# Patient Record
Sex: Female | Born: 1970 | Race: Black or African American | Hispanic: No | Marital: Married | Smoking: Never smoker
Health system: Southern US, Community
[De-identification: ages and names within clinical notes are randomized; demographics above are authoritative.]

## PROBLEM LIST (undated history)

## (undated) DIAGNOSIS — E119 Type 2 diabetes mellitus without complications: Secondary | ICD-10-CM

## (undated) DIAGNOSIS — I10 Essential (primary) hypertension: Secondary | ICD-10-CM

## (undated) DIAGNOSIS — E7800 Pure hypercholesterolemia, unspecified: Secondary | ICD-10-CM

## (undated) HISTORY — DX: Essential (primary) hypertension: I10

## (undated) HISTORY — PX: BREAST REDUCTION SURGERY: SHX8

## (undated) HISTORY — DX: Type 2 diabetes mellitus without complications: E11.9

---

## 1992-04-14 HISTORY — PX: REDUCTION MAMMAPLASTY: SUR839

## 1997-07-25 ENCOUNTER — Other Ambulatory Visit: Admission: RE | Admit: 1997-07-25 | Discharge: 1997-07-25 | Payer: Self-pay | Admitting: *Deleted

## 1997-07-25 ENCOUNTER — Ambulatory Visit (HOSPITAL_COMMUNITY): Admission: RE | Admit: 1997-07-25 | Discharge: 1997-07-25 | Payer: Self-pay | Admitting: *Deleted

## 1997-10-25 ENCOUNTER — Ambulatory Visit (HOSPITAL_COMMUNITY): Admission: RE | Admit: 1997-10-25 | Discharge: 1997-10-25 | Payer: Self-pay | Admitting: *Deleted

## 1997-11-15 ENCOUNTER — Inpatient Hospital Stay (HOSPITAL_COMMUNITY): Admission: AD | Admit: 1997-11-15 | Discharge: 1997-11-18 | Payer: Self-pay | Admitting: Obstetrics

## 1999-06-07 ENCOUNTER — Inpatient Hospital Stay (HOSPITAL_COMMUNITY): Admission: AD | Admit: 1999-06-07 | Discharge: 1999-06-07 | Payer: Self-pay | Admitting: *Deleted

## 1999-06-07 ENCOUNTER — Encounter: Payer: Self-pay | Admitting: Obstetrics

## 1999-06-13 ENCOUNTER — Other Ambulatory Visit: Admission: RE | Admit: 1999-06-13 | Discharge: 1999-06-13 | Payer: Self-pay | Admitting: Obstetrics

## 1999-06-29 ENCOUNTER — Emergency Department (HOSPITAL_COMMUNITY): Admission: EM | Admit: 1999-06-29 | Discharge: 1999-06-29 | Payer: Self-pay | Admitting: Emergency Medicine

## 1999-10-01 ENCOUNTER — Inpatient Hospital Stay (HOSPITAL_COMMUNITY): Admission: AD | Admit: 1999-10-01 | Discharge: 1999-10-04 | Payer: Self-pay | Admitting: Obstetrics

## 1999-10-01 ENCOUNTER — Encounter (INDEPENDENT_AMBULATORY_CARE_PROVIDER_SITE_OTHER): Payer: Self-pay

## 2002-09-12 ENCOUNTER — Encounter: Admission: RE | Admit: 2002-09-12 | Discharge: 2002-09-12 | Payer: Self-pay | Admitting: Internal Medicine

## 2002-09-12 ENCOUNTER — Encounter: Payer: Self-pay | Admitting: Internal Medicine

## 2003-04-15 HISTORY — PX: NOVASURE ABLATION: SHX5394

## 2004-01-10 ENCOUNTER — Ambulatory Visit (HOSPITAL_COMMUNITY): Admission: RE | Admit: 2004-01-10 | Discharge: 2004-01-10 | Payer: Self-pay | Admitting: Obstetrics

## 2004-01-10 ENCOUNTER — Encounter (INDEPENDENT_AMBULATORY_CARE_PROVIDER_SITE_OTHER): Payer: Self-pay | Admitting: Specialist

## 2005-02-18 ENCOUNTER — Emergency Department (HOSPITAL_COMMUNITY): Admission: EM | Admit: 2005-02-18 | Discharge: 2005-02-19 | Payer: Self-pay | Admitting: *Deleted

## 2006-09-11 ENCOUNTER — Emergency Department (HOSPITAL_COMMUNITY): Admission: EM | Admit: 2006-09-11 | Discharge: 2006-09-11 | Payer: Self-pay | Admitting: Emergency Medicine

## 2010-08-30 NOTE — Discharge Summary (Signed)
[facility]  Patient:    Alyssa Buck, Alyssa Buck                     MRN: [id number] Adm. Date:  [id number] Disc. Date: [id number] Attending:  Venita Sheffield                           Discharge Summary  HOSPITAL COURSE:              Patient is a 40 year old gravida 3, para 2-0-0-2, whose EDC was October 03, 1999.  She had two previous C-sections and she was in for repeat cesarean section and tubal ligation.  She had a positive group B strep.  On October 01, 1999, she underwent repeat low transverse cesarean section and tubal ligation.  She had a female, Apgars 8/9, weighing 8 pounds 10 ounces.  The placenta was fundal and the amniotic fluid was meconium stained.  On admission, her hemoglobin was 11; post delivery, it was 8.8.  She did well and was discharged home on the third postoperative day, ambulatory, on a regular diet, on Tylenol No. 3 one q.4h. p.r.n., to see me in six weeks.  DISCHARGE DIAGNOSES:          1. Status post repeat low transverse cesarean                                  section, at term.                               2. Tubal ligation for multiparity. DD:  11/07/99 TD:  11/08/99 Job: [id number]

## 2010-08-30 NOTE — Op Note (Signed)
[name], Alyssa Buck              ACCOUNT NO.:  [id number]   MEDICAL RECORD NO.:  [id number]          PATIENT TYPE:  AMB   LOCATION:  SDC                           FACILITY:  [facility]   PHYSICIAN:  Kathreen Cosier, M.D.DATE OF BIRTH:  08/07/1970   DATE OF PROCEDURE:  01/10/2004  DATE OF DISCHARGE:                                 OPERATIVE REPORT   PREOPERATIVE DIAGNOSIS:  Dysfunctional uterine bleeding.   POSTOPERATIVE DIAGNOSIS:  Dysfunctional uterine bleeding.   PROCEDURE:  Sharp curettage, hysteroscopy, NovaSure ablation.   SURGEON:  Kathreen Cosier, M.D.   DESCRIPTION OF PROCEDURE:  Under general anesthesia, laminaria was removed  from the cervix, and the perineum, vagina were prepped and draped.  Speculum  was placed in the vagina.  Anterior lip of the cervix grasped with the  tenaculum, and the cervix curetted with a small amount of tissue obtained.  The endometrial cavity was sounded to 11 cm, and the cervical length was  measured at 5 cm.  The uterine cavity length was 5 cm.  The cervix was  dilated to a #27 Shawnie Pons and the hysteroscope inserted.  The cavity showed  some polyps and the procedure was terminated.  A sharp curettage was then  performed and a large amount of tissue obtained.  The NovaSure device was  then inserted, and the cavity integrity was established.  Then, NovaSure  ablation was done for 49 seconds under power of __________ watts.  The  cavity was 4.5 cm.  After the ablation, the hysteroscopy was redone, and the  hysteroscopy was redone, and the cavity was totally ablated.  The fluid  deficit from the hysteroscopy was 30 mL.  The patient tolerated the  procedure well and was taken to the recovery room in good condition.      BAM/MEDQ  D:  01/10/2004  T:  01/10/2004  Job:  [id number]

## 2018-12-07 ENCOUNTER — Other Ambulatory Visit: Payer: Self-pay

## 2018-12-07 DIAGNOSIS — Z20822 Contact with and (suspected) exposure to covid-19: Secondary | ICD-10-CM

## 2018-12-08 LAB — NOVEL CORONAVIRUS, NAA: SARS-CoV-2, NAA: NOT DETECTED

## 2019-08-29 ENCOUNTER — Encounter: Payer: Self-pay | Admitting: Internal Medicine

## 2019-08-29 ENCOUNTER — Ambulatory Visit: Payer: Self-pay | Admitting: Internal Medicine

## 2019-08-29 VITALS — BP 161/96 | HR 91 | Temp 98.6°F | Ht 68.0 in | Wt 196.8 lb

## 2019-08-29 DIAGNOSIS — I10 Essential (primary) hypertension: Secondary | ICD-10-CM

## 2019-08-29 DIAGNOSIS — E042 Nontoxic multinodular goiter: Secondary | ICD-10-CM

## 2019-08-29 DIAGNOSIS — Z780 Asymptomatic menopausal state: Secondary | ICD-10-CM

## 2019-08-29 MED ORDER — LISINOPRIL-HYDROCHLOROTHIAZIDE 20-12.5 MG PO TABS: 1.0000 | ORAL_TABLET | Freq: Every day | ORAL | 0 refills | Status: DC

## 2019-08-29 NOTE — Assessment & Plan Note (Addendum)
Lump in throat since 2003. At prior pcp office she states that her thyroid levels were within normal range. Denies hair loss, heat/cold intolerance, sweating, nail changes, mood changes. Paternal aunt had thyroid issues.   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  Did a point-of-care ultrasound at bedside which showed several nodules on both sides of thyroid.  -get records from [facility] -Thyroid ultrasound -tsh -Patient follow-up with financial services to get assistance with affording these tests.

## 2019-08-29 NOTE — Patient Instructions (Addendum)
It was a pleasure to see you today [name]. Please make the following changes:  For your thyroid: -we got bloodwork  -please get thyroid ultrasound done   For blood pressure:  Please start taking lisinopril-hydrochlorothiazide 20-12.5mg  once daily  Follow up in 4 weeks  If you have any questions or concerns, please call our clinic at [phone] between 9am-5pm and after hours call [phone] and ask for the internal medicine resident on call. If you feel you are having a medical emergency please call 911.   Thank you, we look forward to help you remain healthy!  Lorenso Courier, MD Internal Medicine PGY3

## 2019-08-29 NOTE — Assessment & Plan Note (Signed)
Patient states that she has had irregular periods over the past year.  Her last menstrual period was in the beginning of May and prior to that was in March 2021.  She states that her cycles usually last 7 days.  The amount of menstrual bleeding that she has had has decreased significantly since her NovaSure procedure.  She is currently sexually active and does not use protection.  Her she had a tubal ligation done in 2001.  She has accompanied symptoms of hot flashes at night, vaginal dryness.  Assessment and plan  Patient signs and symptoms are consistent with menopause.  We will continue to monitor patient and if her symptoms are excessive can consider symptom reduction treatment at that time.

## 2019-08-29 NOTE — Assessment & Plan Note (Signed)
The patient has an elevated blood pressure of 156/103 and the repeat was 161/96. She has a strong family history of hypertension.  She is overweight with a BMI of 29.  Assessment and plan Patient's blood pressure is uncontrolled and will likely need to antihypertensive agents.  We will start on a combination lisinopril-hydrochlorothiazide 20-12.5 mg daily pill.  He is also to do lifestyle modifications.  We will follow-up in 4 weeks.

## 2019-08-29 NOTE — Progress Notes (Signed)
   CC: lump in throat and abnormal uterine bleeding  HPI:  Alyssa Buck is a 49 y.o. with no known medical problems who presents for evaluation of lump in throat and decreased menstrual bleeding. Please see problem based charting for evaluation, assessment, and plan.   No past medical history on file.   Social History   Socioeconomic History  . Marital status: Married    Spouse name: Not on file  . Number of children: Not on file  . Years of education: Not on file  . Highest education level: Not on file  Occupational History  . Occupation: early childhood educator  Tobacco Use  . Smoking status: Never Smoker  Substance and Sexual Activity  . Alcohol use: Not on file  . Drug use: Never  . Sexual activity: Not on file  Other Topics Concern  . Not on file  Social History Narrative  . Not on file   Social Determinants of Health   Financial Resource Strain:   . Difficulty of Paying Living Expenses:   Food Insecurity:   . Worried About Programme researcher, broadcasting/film/video in the Last Year:   . Barista in the Last Year:   Transportation Needs:   . Freight forwarder (Medical):   Marland Kitchen Lack of Transportation (Non-Medical):   Physical Activity:   . Days of Exercise per Week:   . Minutes of Exercise per Session:   Stress:   . Feeling of Stress :   Social Connections:   . Frequency of Communication with Friends and Family:   . Frequency of Social Gatherings with Friends and Family:   . Attends Religious Services:   . Active Member of Clubs or Organizations:   . Attends Banker Meetings:   Marland Kitchen Marital Status:    Family History  Problem Relation Age of Onset  . Hypertension Mother   . Hypertension Father   . Diabetes Mellitus II Father   . Hypertension Maternal Grandmother   . Heart failure Maternal Grandmother   . Lymphoma Maternal Grandmother        big lymph nodes   . Hypertension Paternal Grandmother   . Breast cancer Maternal Aunt    Review of  Systems:   Per hpi  Physical Exam:  Vitals:   08/29/19 1317  BP: (!) 156/103  Pulse: 94  Temp: 98.6 F (37 C)  TempSrc: Oral  SpO2: 100%  Weight: 196 lb 12.8 oz (89.3 kg)  Height: 5\' 8"  (1.727 m)   Physical Exam  Constitutional: She is oriented to person, place, and time. She appears well-developed and well-nourished. No distress.  Neck:  Firm rounded goiter that is approximately 2-3in in size predominant on right side of thyroid. Small nodules palpated. No tenderness to palpation  Cardiovascular: Normal rate, regular rhythm and normal heart sounds.  Respiratory: Effort normal and breath sounds normal. No respiratory distress. She has no wheezes.  GI: Soft. Bowel sounds are normal. She exhibits no distension. There is no abdominal tenderness.  Musculoskeletal:        General: No edema.  Neurological: She is alert and oriented to person, place, and time.  Skin: She is not diaphoretic.  Psychiatric: She has a normal mood and affect. Her behavior is normal. Judgment and thought content normal.   Assessment & Plan:   See Encounters Tab for problem based charting.  Patient discussed with Dr. 

## 2019-08-30 LAB — BMP8+ANION GAP
Anion Gap: 13 mmol/L (ref 10.0–18.0)
BUN/Creatinine Ratio: 20 (ref 9–23)
BUN: 9 mg/dL (ref 6–24)
CO2: 25 mmol/L (ref 20–29)
Calcium: 9 mg/dL (ref 8.7–10.2)
Chloride: 98 mmol/L (ref 96–106)
Creatinine, Ser: 0.46 mg/dL — ABNORMAL LOW (ref 0.57–1.00)
GFR calc Af Amer: 138 mL/min/{1.73_m2} (ref >59)
GFR calc non Af Amer: 120 mL/min/{1.73_m2} (ref >59)
Glucose: 232 mg/dL — ABNORMAL HIGH (ref 65–99)
Potassium: 3.5 mmol/L (ref 3.5–5.2)
Sodium: 136 mmol/L (ref 134–144)

## 2019-08-30 LAB — TSH: TSH: 0.729 u[IU]/mL (ref 0.450–4.500)

## 2019-08-30 NOTE — Progress Notes (Signed)
Internal Medicine Clinic Attending  I saw and evaluated the patient.  I personally confirmed the key portions of the history and exam documented by Dr. Delma Officer and I reviewed pertinent patient test results.  The assessment, diagnosis, and plan were formulated together and I agree with the documentation in the resident's note.   Palpable right sided nodules of the thyroid along with diffuse enlargement. Given normal TSH and long term stability, most consistent with multinodular goiter. We discussed thyroid ultrasound and potential need for biopsy.

## 2019-09-05 ENCOUNTER — Telehealth: Payer: Self-pay | Admitting: Internal Medicine

## 2019-09-05 NOTE — Telephone Encounter (Signed)
Pt requesting a call back about her Lab results. 

## 2019-09-06 NOTE — Telephone Encounter (Signed)
The patient has sch an appt for 09/07/2019 with [facility].

## 2019-09-06 NOTE — Telephone Encounter (Signed)
Please call patient and arrange a bp follow up appt with me today please. Patient states that she has adverse reaction to bp meds. Needs to be seen today please

## 2019-09-07 ENCOUNTER — Ambulatory Visit (INDEPENDENT_AMBULATORY_CARE_PROVIDER_SITE_OTHER): Payer: Self-pay | Admitting: Internal Medicine

## 2019-09-07 ENCOUNTER — Other Ambulatory Visit: Payer: Self-pay

## 2019-09-07 VITALS — BP 123/79 | HR 88 | Temp 98.1°F | Ht 68.0 in | Wt 192.2 lb

## 2019-09-07 DIAGNOSIS — I10 Essential (primary) hypertension: Secondary | ICD-10-CM

## 2019-09-07 NOTE — Assessment & Plan Note (Signed)
Patient presenting for follow up of her hypertension. BP 123/79 today. She was seen in clinic one week ago with elevated BP reading as below. She does not have prior personal history of hypertension and notes that previously, her BP has been wnl. She does have strong family history of hypertension. She was started on lisinopril-HCTZ 20-12.5mg  at last visit. Patient notes medication compliance. However, she does note that four days ago, she had extreme nausea with episode of emesis, near-syncopal episode. She also notes worsening flushing and also endorses cough since starting medication. Suspect this may be secondary to lisinopril vs hypotensive episodes. She has not taken her antihypertensives for the past two days and notes feeling better.  BP Readings from Last 3 Encounters:  09/07/19 123/79  08/29/19 (!) 161/96   Assessment and plan: Patient's BP 123/79 today. Suspect her antihypertensive regimen may have resulted in hypotensive episodes vs lisinopril side effects with cough and flushing. Will check BMP today to assess for any electrolyte abnormalities that may have resulted in her symptoms. Given that she does not have personal history of hypertension, and she is motivated for lifestyle modification, will have trial off of antihypertensives.  Patient instructed for lifestyle modification with low sodium diet and exercise. She is also instructed to keep BP log. Follow up in 4 weeks.

## 2019-09-07 NOTE — Progress Notes (Signed)
   CC: flushing and cough  HPI:  [name] is a 49 y.o. female with PMHx of hypertension and multinodular goiter presenting with nausea, GI symptoms, near-syncopal episodes, flushing and cough. Please see problem based charting for full assessment and plan.   No past medical history on file. Review of Systems:  Negative except as stated in HPI.   Physical Exam:  Vitals:   09/07/19 0839  BP: 123/79  Pulse: 88  Temp: 98.1 F (36.7 C)  TempSrc: Oral  SpO2: 100%  Weight: 192 lb 3.2 oz (87.2 kg)  Height: 5\' 8"  (1.727 m)   Physical Exam Constitutional:      Appearance: Normal appearance. She is not ill-appearing.  HENT:     Mouth/Throat:     Mouth: Mucous membranes are moist.     Pharynx: Oropharynx is clear.  Eyes:     Extraocular Movements: Extraocular movements intact.     Conjunctiva/sclera: Conjunctivae normal.  Cardiovascular:     Rate and Rhythm: Normal rate and regular rhythm.     Pulses: Normal pulses.     Heart sounds: Normal heart sounds. No murmur. No friction rub.  Pulmonary:     Effort: Pulmonary effort is normal. No respiratory distress.     Breath sounds: Normal breath sounds. No stridor. No wheezing.  Abdominal:     General: Bowel sounds are normal.     Palpations: Abdomen is soft.  Musculoskeletal:        General: Normal range of motion.  Skin:    General: Skin is warm and dry.     Capillary Refill: Capillary refill takes less than 2 seconds.  Neurological:     General: No focal deficit present.     Mental Status: She is alert and oriented to person, place, and time. Mental status is at baseline.     Sensory: No sensory deficit.     Motor: No weakness.      Assessment & Plan:   See Encounters Tab for problem based charting.  Patient discussed with Dr. 

## 2019-09-07 NOTE — Progress Notes (Signed)
Internal Medicine Clinic Attending  Case discussed with Dr. Aslam at the time of the visit.  We reviewed the resident's history and exam and pertinent patient test results.  I agree with the assessment, diagnosis, and plan of care documented in the resident's note.  

## 2019-09-07 NOTE — Patient Instructions (Addendum)
Ms. Sliva,  It was a pleasure seeing you in clinic. Today we discussed:   Hypertension: At this time, I would recommend you discontinue the antihypertensives. Please monitor your BP daily in the BP log. Please continue lifestyle modification with low-sodium diet and daily exercise for at least 30 minutes per day. We will see you in 4 weeks for follow up.   If you have any questions or concerns, please call our clinic at [phone] between 9am-5pm and after hours call [phone] and ask for the internal medicine resident on call. If you feel you are having a medical emergency please call 911.   Thank you, we look forward to helping you remain healthy!  If you have not already done so, please get your COVID-19 vaccine.  To schedule an appointment for a COVID vaccine or be added to the vaccine wait list: Go to [email or web address]   OR Go to [email or web address]                  OR Call [phone]                                     OR Call [phone] and select Option 2

## 2019-09-08 LAB — BMP8+ANION GAP
Anion Gap: 14 mmol/L (ref 10.0–18.0)
BUN/Creatinine Ratio: 28 — ABNORMAL HIGH (ref 9–23)
BUN: 17 mg/dL (ref 6–24)
CO2: 24 mmol/L (ref 20–29)
Calcium: 9.1 mg/dL (ref 8.7–10.2)
Chloride: 96 mmol/L (ref 96–106)
Creatinine, Ser: 0.61 mg/dL (ref 0.57–1.00)
GFR calc Af Amer: 126 mL/min/{1.73_m2} (ref >59)
GFR calc non Af Amer: 109 mL/min/{1.73_m2} (ref >59)
Glucose: 379 mg/dL — ABNORMAL HIGH (ref 65–99)
Potassium: 3.3 mmol/L — ABNORMAL LOW (ref 3.5–5.2)
Sodium: 134 mmol/L (ref 134–144)

## 2019-09-21 ENCOUNTER — Ambulatory Visit: Payer: Self-pay

## 2019-09-21 ENCOUNTER — Other Ambulatory Visit: Payer: Self-pay | Admitting: Internal Medicine

## 2019-09-21 ENCOUNTER — Ambulatory Visit (INDEPENDENT_AMBULATORY_CARE_PROVIDER_SITE_OTHER): Payer: Self-pay | Admitting: Internal Medicine

## 2019-09-21 ENCOUNTER — Encounter: Payer: Self-pay | Admitting: Internal Medicine

## 2019-09-21 VITALS — BP 178/102 | HR 84 | Temp 98.8°F | Ht 68.0 in | Wt 200.0 lb

## 2019-09-21 DIAGNOSIS — I10 Essential (primary) hypertension: Secondary | ICD-10-CM

## 2019-09-21 DIAGNOSIS — E1169 Type 2 diabetes mellitus with other specified complication: Secondary | ICD-10-CM

## 2019-09-21 DIAGNOSIS — E139 Other specified diabetes mellitus without complications: Secondary | ICD-10-CM

## 2019-09-21 DIAGNOSIS — E119 Type 2 diabetes mellitus without complications: Secondary | ICD-10-CM

## 2019-09-21 DIAGNOSIS — R600 Localized edema: Secondary | ICD-10-CM

## 2019-09-21 MED ORDER — METFORMIN HCL ER 500 MG PO TB24: 500.0000 mg | ORAL_TABLET | Freq: Every day | ORAL | 1 refills | Status: DC

## 2019-09-21 MED ORDER — METFORMIN HCL ER 500 MG PO TB24
500.0000 mg | ORAL_TABLET | Freq: Every day | ORAL | 1 refills | Status: DC
Start: 2019-09-21 — End: 2019-09-21

## 2019-09-21 MED ORDER — LOSARTAN POTASSIUM 25 MG PO TABS: 25.0000 mg | ORAL_TABLET | Freq: Every day | ORAL | 1 refills | Status: DC

## 2019-09-21 MED ORDER — LOSARTAN POTASSIUM 25 MG PO TABS
25.0000 mg | ORAL_TABLET | Freq: Every day | ORAL | 1 refills | Status: DC
Start: 2019-09-21 — End: 2019-09-21

## 2019-09-21 NOTE — Progress Notes (Signed)
   CC: Hypertension, diabetes, and lower extremity swelling  HPI:  Alyssa Buck is a 49 y.o. with a history of goiter, prior hx of diabetes, and hypertension noted on prior visits, not currently on any medications. She was seen in clinic on 08/29/19 for follow up of her HTN.  Patient reports that she is feeling okay today, she had a recent death in her family and is feeling a little sad about that.  She denies any symptoms at this time including chest pain, nausea, vomiting, headaches, lightheadedness, dizziness, diarrhea, or abdominal pain.  She was seen on 09/07/2019 and at that time she had not been taking her blood pressure medications due to nausea, emesis, and a near syncopal episode.  It was felt that that may have been related to the lisinopril and the hydrochlorothiazide so these were stopped.    Patient reports that she has had a history of diabetes in the past and had been on Metformin before, this was back in 2007.  She is not on any medications at this time.  Her glucose during the last 2 visits have been elevated, at 232 and 379.  She denies any polyuria, polydipsia, abdominal pain, nausea, vomiting, numbness or tingling, or any other symptoms at this time.    No past medical history on file. Review of Systems:   Constitutional: Negative for chills and fever.  Respiratory: Negative for shortness of breath.   Cardiovascular: Negative for chest pain, reports leg swelling.  Gastrointestinal: Negative for abdominal pain, nausea and vomiting.  Neurological: Negative for dizziness and headaches.   Physical Exam:  Vitals:   09/21/19 1348 09/21/19 1439  BP: (!) 190/119 (!) 178/102  Pulse: 90 84  Temp: 98.8 F (37.1 C)   TempSrc: Oral   SpO2: 100%   Weight: 200 lb (90.7 kg)   Height: 5\' 8"  (1.727 m)    Physical Exam HENT:     Head: Normocephalic and atraumatic.  Eyes:     Extraocular Movements: EOM normal.     Pupils: Pupils are equal, round, and reactive to light.    Neck:     Thyroid: No thyromegaly.  Cardiovascular:     Rate and Rhythm: Normal rate and regular rhythm.     Heart sounds: Normal heart sounds.  Pulmonary:     Effort: Pulmonary effort is normal. No respiratory distress.     Breath sounds: Normal breath sounds.  Abdominal:     General: Bowel sounds are normal. There is no distension.     Palpations: Abdomen is soft.  Musculoskeletal:        General: Edema (Bilateral non-pitting edema) present. Normal range of motion.     Cervical back: Normal range of motion and neck supple.  Skin:    General: Skin is warm and dry.  Neurological:     Mental Status: She is alert and oriented to person, place, and time.  Psychiatric:        Mood and Affect: Mood and affect normal.     Assessment & Plan:   See Encounters Tab for problem based charting.  Patient discussed with Dr. 

## 2019-09-21 NOTE — Patient Instructions (Addendum)
[name],  It was a pleasure to see you today. Thank you for coming in.   Today we discussed your blood pressure. Your blood pressure was elevated today, please start taking losartan 25 daily. Please continue monitoring your sodium intake. You can try the DASH diet to improve your blood pressure.   Your glucose was elevated on your last visit, given your history of diabetes we will be restarting the metformin and check some labs.   Please return to clinic in 1 week or sooner if needed.   Thank you again for coming in.   Claudean Severance.D.

## 2019-09-22 DIAGNOSIS — R600 Localized edema: Secondary | ICD-10-CM | POA: Insufficient documentation

## 2019-09-22 DIAGNOSIS — E1165 Type 2 diabetes mellitus with hyperglycemia: Secondary | ICD-10-CM | POA: Insufficient documentation

## 2019-09-22 DIAGNOSIS — E119 Type 2 diabetes mellitus without complications: Secondary | ICD-10-CM | POA: Insufficient documentation

## 2019-09-22 NOTE — Assessment & Plan Note (Signed)
Patient reports that she is feeling okay today, she had a recent death in her family and is feeling a little sad about that.  She denies any symptoms at this time including chest pain, nausea, vomiting, headaches, lightheadedness, dizziness, diarrhea, or abdominal pain.  She was seen on 09/07/2019 and at that time she had not been taking her blood pressure medications due to nausea, emesis, and a near syncopal episode.  It was felt that that may have been related to the lisinopril and the hydrochlorothiazide so these were stopped.  Today her blood pressure is elevated at 190/119, repeat was 178/102.  She does seem to have hypertension.  Discussed lifestyle modifications and starting a low-dose of losartan today.  She is in agreement with this plan.  -Start losartan 25 mg daily -Repeat BMP today

## 2019-09-22 NOTE — Assessment & Plan Note (Signed)
Patient reports that she has had a history of diabetes in the past and had been on Metformin before, this was back in 2007.  She is not on any medications at this time.  Her glucose during the last 2 visits have been elevated, at 232 and 379.  She denies any polyuria, polydipsia, abdominal pain, nausea, vomiting, numbness or tingling, or any other symptoms at this time.  Given her prior history of diabetes we will start her back on metformin and check an A1c today.  -A1c -Metformin 500 mg daily -Return to clinic in 1 week

## 2019-09-22 NOTE — Assessment & Plan Note (Signed)
Patient reports that she has been having this lower extremity swelling for the past week or so, occurs in both of her legs, does not cause any pain.  It improves with elevation and compression stockings.  She is currently not taking any medications.  On exam she has bilateral nonpitting edema.  Could be consistent with venous insufficiency.  We will continue to monitor and advised to continue wearing compression stockings.

## 2019-09-26 ENCOUNTER — Ambulatory Visit (HOSPITAL_COMMUNITY)
Admission: RE | Admit: 2019-09-26 | Discharge: 2019-09-26 | Disposition: A | Discharge status: Home / Self Care | Payer: Self-pay | Source: Ambulatory Visit | Attending: Internal Medicine | Admitting: Internal Medicine

## 2019-09-26 ENCOUNTER — Other Ambulatory Visit: Payer: Self-pay

## 2019-09-26 DIAGNOSIS — E042 Nontoxic multinodular goiter: Secondary | ICD-10-CM | POA: Insufficient documentation

## 2019-09-26 NOTE — Progress Notes (Signed)
Internal Medicine Clinic Attending  Case discussed with Dr. Krienke at the time of the visit.  We reviewed the resident's history and exam and pertinent patient test results.  I agree with the assessment, diagnosis, and plan of care documented in the resident's note.    

## 2019-09-29 ENCOUNTER — Other Ambulatory Visit: Payer: Self-pay

## 2019-09-29 ENCOUNTER — Encounter: Payer: Self-pay | Admitting: Internal Medicine

## 2019-09-29 ENCOUNTER — Ambulatory Visit: Payer: Self-pay | Admitting: Internal Medicine

## 2019-09-29 VITALS — BP 133/90 | HR 92 | Temp 98.4°F | Ht 68.0 in | Wt 194.7 lb

## 2019-09-29 DIAGNOSIS — E042 Nontoxic multinodular goiter: Secondary | ICD-10-CM

## 2019-09-29 DIAGNOSIS — I10 Essential (primary) hypertension: Secondary | ICD-10-CM

## 2019-09-29 DIAGNOSIS — E1169 Type 2 diabetes mellitus with other specified complication: Secondary | ICD-10-CM

## 2019-09-29 LAB — POCT GLYCOSYLATED HEMOGLOBIN (HGB A1C): Hemoglobin A1C: 11.4 % — AB (ref 4.0–5.6)

## 2019-09-29 LAB — GLUCOSE, CAPILLARY: Glucose-Capillary: 230 mg/dL — ABNORMAL HIGH (ref 70–99)

## 2019-09-29 MED ORDER — METFORMIN HCL ER 500 MG PO TB24: 500.0000 mg | ORAL_TABLET | Freq: Two times a day (BID) | ORAL | 0 refills | Status: DC

## 2019-09-29 NOTE — Patient Instructions (Addendum)
Today we increased Metformin as below: Take 1 tablet (500 mg) in the morning and 1 tablet (500 mg) at night. Continue Losartan 25 mg daily. F/u in clinic in 2 weeks. Please go to radiology for thyroid biopsy.

## 2019-09-29 NOTE — Assessment & Plan Note (Addendum)
Uncontrolled type 2 DM likely 2/2 gap in medical care and poor compliance to Metfromin.  She has been off of Metformin for several years and then again started on Metfromin by prior PCP but she did not follow up with them after 2010. On Metfromin 500 mg QD  -Checking HbA1c today>HbA1c today is elevated at 11.4 -Increase Metformin to 500 mg BID. Will escalate it as tolerates -Considering adding SGLT2 inhibitor, or GLP 1 agonist next visits, after escalting Metformin and also once she gets [facility] card -F/u in clinic in 2 weeks -Referral to DM and nutrition service next visit

## 2019-09-29 NOTE — Assessment & Plan Note (Signed)
Multinodular goiter: Non toxic Patient has history of goiter, and she found to have multiple nodule on point-of-care ultrasound at [facility] on prior visit.  TSH was checked and came back normal at 0.7.  Thyroid ultrasound performed and showed thyromegaly with multiple nodules, recommended FNA biopsy of moderately suspicious 1.7 cm inferior left nodule and also annual or biennial follow-up ultrasound follow-up ultrasound of mid left nodule, until stability x5 years No signs of symptoms of hyperthyroidism. TSH normal. She is not aware of any FHx of thyroid cancer but she know that her aunt had her thyroid removed.  -US guided FNA of thyroid nodule -Also recommended every 6-28-month follow-up ultrasound of mid left nodule, until stability x5 years confirmed

## 2019-09-29 NOTE — Progress Notes (Signed)
   CC: Follow-up of multinodular goiter.  HPI:  [name] is a 49 y.o. female with past medical history, significant for enlarged thyroid, and recently diagnosed multinodular goiter, HTN, DM, bilateral leg edema, who presented for follow-up of multinodular goiter. Please refer to problem based charting for further details and assessment and plan.  PMH: enlarged thyroid, and recently diagnosed multinodular goiter,HTN, DM, bilateral leg edema  Medication history: Losartan 25 mg daily, Metformin 500 mg daily  FHx: Hx of thyroidectomy in aunt (patient is not sure why and does not think she had cancer)   Review of Systems:   Constitutional: Negative for chills and fever. Negative for weight change, negative for heat or cold intolerance, negative for skin changes Respiratory: Negative for shortness of breath.   Cardiovascular: Negative for chest pain and leg swelling.  Gastrointestinal: Negative for abdominal pain, nausea and vomiting, positive for loose stool around cycle time Neurological: Negative for dizziness and headaches.   Physical Exam:  Vitals:   09/29/19 1347  BP: 133/90  Pulse: 92  Temp: 98.4 F (36.9 C)  TempSrc: Oral  SpO2: 100%  Weight: 194 lb 11.2 oz (88.3 kg)  Height: 5\' 8"  (1.727 m)  Physical Exam Constitutional:      General: She is not in acute distress.    Appearance: Normal appearance. She is not ill-appearing.  Neck:     Thyroid: Thyromegaly present. No thyroid tenderness.  Cardiovascular:     Rate and Rhythm: Normal rate and regular rhythm.     Pulses: Normal pulses.     Heart sounds: Normal heart sounds. No murmur heard.   Pulmonary:     Effort: No respiratory distress.     Breath sounds: No stridor. No wheezing, rhonchi or rales.  Abdominal:     General: There is no distension.     Palpations: Abdomen is soft.     Tenderness: There is no abdominal tenderness.  Musculoskeletal:     Right lower leg: No edema.     Left lower leg: No  edema.  Skin:    General: Skin is warm and dry.  Neurological:     General: No focal deficit present.     Mental Status: She is alert and oriented to person, place, and time.  Psychiatric:        Mood and Affect: Mood normal.        Behavior: Behavior normal.        Thought Content: Thought content normal.        Judgment: Judgment normal.      Assessment & Plan:  See Encounters Tab for problem based charting.  Patient discussed with Dr. 

## 2019-09-29 NOTE — Assessment & Plan Note (Signed)
Losartan 25 mg QD started last week and BP now improved to 133/90.  No side effect with medication and she reports compliance.  Recent BP were: BP Readings from Last 3 Encounters:  09/29/19 133/90  09/21/19 (!) 178/102  09/07/19 123/79    -Continue Losartan 25 mg QD -BMP next visit -Check BP at home twice a day and bring BP log next visit

## 2019-09-30 NOTE — Progress Notes (Signed)
Internal Medicine Clinic Attending  Case discussed with Dr. Masoudi  at the time of the visit.  We reviewed the resident's history and exam and pertinent patient test results.  I agree with the assessment, diagnosis, and plan of care documented in the resident's note.  

## 2019-10-10 MED FILL — METFORMIN HCL ER 500 MG TB2: 500 | 30 days supply | Qty: 30 | Fill #1

## 2019-10-14 ENCOUNTER — Encounter: Payer: Self-pay | Admitting: Internal Medicine

## 2019-10-14 ENCOUNTER — Ambulatory Visit (INDEPENDENT_AMBULATORY_CARE_PROVIDER_SITE_OTHER): Payer: 59 | Admitting: Internal Medicine

## 2019-10-14 ENCOUNTER — Other Ambulatory Visit: Payer: Self-pay

## 2019-10-14 ENCOUNTER — Other Ambulatory Visit (HOSPITAL_COMMUNITY): Payer: Self-pay | Admitting: Internal Medicine

## 2019-10-14 VITALS — BP 136/89 | HR 94 | Temp 98.0°F | Ht 68.0 in | Wt 192.9 lb

## 2019-10-14 DIAGNOSIS — E1169 Type 2 diabetes mellitus with other specified complication: Secondary | ICD-10-CM | POA: Diagnosis not present

## 2019-10-14 MED ORDER — ONETOUCH DELICA LANCETS 30G MISC: 1.0000 | Freq: Two times a day (BID) | 2 refills | Status: DC

## 2019-10-14 MED ORDER — ONETOUCH ULTRA 2 W/DEVICE KIT: 1.0000 | PACK | Freq: Two times a day (BID) | 0 refills | Status: DC

## 2019-10-14 MED ORDER — GLUCOSE BLOOD VI STRP: ORAL_STRIP | 12 refills | Status: DC

## 2019-10-14 MED ORDER — CONTOUR NEXT MONITOR W/DEVICE KIT: 1.0000 "application " | PACK | Freq: Two times a day (BID) | 0 refills | Status: DC

## 2019-10-14 MED ORDER — CANAGLIFLOZIN 100 MG PO TABS: 100.0000 mg | ORAL_TABLET | Freq: Every day | ORAL | 1 refills | Status: DC

## 2019-10-14 MED ORDER — CONTOUR NEXT TEST VI STRP
ORAL_STRIP | 1 refills | Status: DC
Start: 2019-10-14 — End: 2019-10-14

## 2019-10-14 MED ORDER — LANCETS 30G MISC: 1.0000 | Freq: Two times a day (BID) | 4 refills | Status: DC

## 2019-10-14 MED ORDER — LANCETS 30G MISC: 1.0000 | Freq: Two times a day (BID) | 2 refills | Status: DC

## 2019-10-14 MED ORDER — CONTOUR NEXT TEST VI STRP: ORAL_STRIP | 5 refills | Status: DC

## 2019-10-14 MED FILL — CONTOUR NEXT STRIPS: 25 days supply | Qty: 50 | Fill #0

## 2019-10-14 MED FILL — MICROLET LANCETS MISC: 30 days supply | Qty: 100 | Fill #0

## 2019-10-14 MED FILL — INVOKANA 100 MG TABLET: 100 | 30 days supply | Qty: 30 | Fill #0

## 2019-10-14 MED FILL — CONTOUR NEXT METER: W/DEVICE | 30 days supply | Qty: 1 | Fill #0

## 2019-10-14 NOTE — Telephone Encounter (Signed)
Requesting to speak with a nurse.  °

## 2019-10-14 NOTE — Assessment & Plan Note (Addendum)
Patient here for close follow-up for diabetes after found to have A1C of 11.4. She did not have insurance at last visit, so was started on Metformin titration alone. She has done well on 500 mg BID. Instructions provided on how to titrate Metformin to 1000 mg BID.  She was able to get insurance through her work, so sent in prescription for glucometer. I advised her to check 1-2x per day. Will also initiate SGLT2 inhibitor. Referral placed to Lupita Leash to assist with education and nutritional recommendations.  Check urine microalbumin.  Will need eye exam this year as well.   Follow-up in 6 weeks with Dr. Maryla Morrow and can get an early repeat A1C at that time.

## 2019-10-14 NOTE — Patient Instructions (Addendum)
Ms. Zobrist, It was nice meeting you!   Today we discussed your diabetes.  For the metformin, start taking 2 pill in the morning and 1 at night for week, followed by 2 pills twice daily. Once you run out, let us know and we can call in a new prescription for Metformin 1000 mg twice daily.  I am also starting you on a once daily medication called Invokana that should improve your blood sugar control as well.   We'll plan to see you back in 6 weeks to see how things are going.   Take care, Dr. Chesley Mires

## 2019-10-14 NOTE — Progress Notes (Signed)
Acute Office Visit  Subjective:    Patient ID: Alyssa Buck, female    DOB: January 24, 1971, 49 y.o.   MRN: [id number]  Chief Complaint  Patient presents with  . Follow-up    HPI Patient is in today for Diabetes follow-up. Please see problem based charting for further details.   No past medical history on file.  Past Surgical History:  Procedure Laterality Date  . BREAST REDUCTION SURGERY    . [place], 2001  . [place] ABLATION  2005   abnormal uterine bleeding    Family History  Problem Relation Age of Onset  . Hypertension Mother   . Hypertension Father   . Diabetes Mellitus II Father   . Hypertension Maternal Grandmother   . Heart failure Maternal Grandmother   . Lymphoma Maternal Grandmother        big lymph nodes   . Hypertension Paternal Grandmother   . Breast cancer Maternal Aunt   . Thyroid disease Paternal Aunt     Social History   Socioeconomic History  . Marital status: Married    Spouse name: Not on file  . Number of children: Not on file  . Years of education: Not on file  . Highest education level: Not on file  Occupational History  . Occupation: early childhood educator  Tobacco Use  . Smoking status: Never Smoker  . Smokeless tobacco: Never Used  Substance and Sexual Activity  . Alcohol use: Not on file  . Drug use: Never  . Sexual activity: Not on file  Other Topics Concern  . Not on file  Social History Narrative  . Not on file   Social Determinants of Health   Financial Resource Strain:   . Difficulty of Paying Living Expenses:   Food Insecurity:   . Worried About Charity fundraiser in the Last Year:   . Arboriculturist in the Last Year:   Transportation Needs:   . Film/video editor (Medical):   Marland Kitchen Lack of Transportation (Non-Medical):   Physical Activity:   . Days of Exercise per Week:   . Minutes of Exercise per Session:   Stress:   . Feeling of Stress :   Social Connections:   .  Frequency of Communication with Friends and Family:   . Frequency of Social Gatherings with Friends and Family:   . Attends Religious Services:   . Active Member of Clubs or Organizations:   . Attends Archivist Meetings:   Marland Kitchen Marital Status:   Intimate Partner Violence:   . Fear of Current or Ex-Partner:   . Emotionally Abused:   Marland Kitchen Physically Abused:   . Sexually Abused:     Outpatient Medications Prior to Visit  Medication Sig Dispense Refill  . losartan (COZAAR) 25 MG tablet TAKE 1 TABLET(25 MG) BY MOUTH DAILY 90 tablet 0  . metFORMIN (GLUCOPHAGE-XR) 500 MG 24 hr tablet Take 1 tablet (500 mg total) by mouth in the morning and at bedtime. 180 tablet 0   No facility-administered medications prior to visit.    Allergies  Allergen Reactions  . Lisinopril Cough    Cough, flushing, nausea    Review of Systems  Constitutional: Negative for chills, fatigue and fever.  Eyes: Negative for visual disturbance.  Respiratory: Negative for shortness of breath.   Cardiovascular: Negative for chest pain.  Gastrointestinal: Negative for abdominal pain and nausea.  Endocrine: Negative for polydipsia, polyphagia and polyuria.  Neurological:  Negative for numbness.       Objective:    Physical Exam Constitutional:      General: She is not in acute distress.    Appearance: Normal appearance.  Cardiovascular:     Rate and Rhythm: Normal rate and regular rhythm.  Pulmonary:     Effort: Pulmonary effort is normal.     Breath sounds: Normal breath sounds.  Abdominal:     General: There is no distension.     Palpations: Abdomen is soft.     Tenderness: There is no abdominal tenderness.  Musculoskeletal:     Right lower leg: No edema.     Left lower leg: No edema.  Neurological:     Mental Status: She is alert.     BP 136/89 (BP Location: Right Arm, Patient Position: Sitting, Cuff Size: Small)   Pulse 94   Temp 98 F (36.7 C) (Oral)   Ht 5' 8"  (1.727 m)   Wt 192 lb  14.4 oz (87.5 kg)   SpO2 100%   BMI 29.33 kg/m  Wt Readings from Last 3 Encounters:  10/14/19 192 lb 14.4 oz (87.5 kg)  09/29/19 194 lb 11.2 oz (88.3 kg)  09/21/19 200 lb (90.7 kg)    Health Maintenance Due  Topic Date Due  . Hepatitis C Screening  Never done  . PNEUMOCOCCAL POLYSACCHARIDE VACCINE AGE 57-64 HIGH RISK  Never done  . FOOT EXAM  Never done  . OPHTHALMOLOGY EXAM  Never done  . HIV Screening  Never done  . TETANUS/TDAP  Never done  . PAP SMEAR-Modifier  Never done    There are no preventive care reminders to display for this patient.   Lab Results  Component Value Date   TSH 0.729 08/29/2019   No results found for: WBC, HGB, HCT, MCV, PLT Lab Results  Component Value Date   NA 134 09/07/2019   K 3.3 (L) 09/07/2019   CO2 24 09/07/2019   GLUCOSE 379 (H) 09/07/2019   BUN 17 09/07/2019   CREATININE 0.61 09/07/2019   CALCIUM 9.1 09/07/2019   No results found for: CHOL No results found for: HDL No results found for: LDLCALC No results found for: TRIG No results found for: CHOLHDL Lab Results  Component Value Date   HGBA1C 11.4 (A) 09/29/2019       Assessment & Plan:   Problem List Items Addressed This Visit      Endocrine   Diabetes (Alyssa Buck) - Primary    Patient here for close follow-up for diabetes after found to have A1C of 11.4. She did not have insurance at last visit, so was started on Metformin titration alone. She has done well on 500 mg BID. Instructions provided on how to titrate Metformin to 1000 mg BID.  She was able to get insurance through her work, so sent in prescription for glucometer. I advised her to check 1-2x per day. Will also initiate SGLT2 inhibitor. Referral placed to Alyssa Buck to assist with education and nutritional recommendations.  Check urine microalbumin.  Will need eye exam this year as well.   Follow-up in 6 weeks with Alyssa Buck and can get an early repeat A1C at that time.       Relevant Medications   canagliflozin  (INVOKANA) 100 MG TABS tablet   Other Relevant Orders   Microalbumin / Creatinine Urine Ratio   Ambulatory referral to diabetic education       Meds ordered this encounter  Medications  . canagliflozin (INVOKANA) 100  MG TABS tablet    Sig: Take 1 tablet (100 mg total) by mouth daily before breakfast.    Dispense:  30 tablet    Refill:  1    IM program  . DISCONTD: Blood Glucose Monitoring Suppl (ONE TOUCH ULTRA 2) w/Device KIT    Sig: 1 applicator by Does not apply route 2 (two) times daily.    Dispense:  1 kit    Refill:  0  . DISCONTD: glucose blood test strip    Sig: Use as instructed    Dispense:  100 each    Refill:  12  . DISCONTD: OneTouch Delica Lancets 58P MISC    Sig: 1 applicator by Does not apply route in the morning and at bedtime.    Dispense:  100 each    Refill:  2  . DISCONTD: Blood Glucose Monitoring Suppl (CONTOUR NEXT MONITOR) w/Device KIT    Sig: 1 application by Does not apply route in the morning and at bedtime. Use to check blood sugar twice daily    Dispense:  1 kit    Refill:  0  . DISCONTD: glucose blood (CONTOUR NEXT TEST) test strip    Sig: Test blood sugar twice daily    Dispense:  300 each    Refill:  1  . DISCONTD: Lancets 30G MISC    Sig: 1 applicator by Does not apply route 2 (two) times daily.    Dispense:  100 each    Refill:  2     Temekia Caskey D Ariahna Smiddy, DO

## 2019-10-15 LAB — MICROALBUMIN / CREATININE URINE RATIO
Creatinine, Urine: 131.6 mg/dL
Microalb/Creat Ratio: 31 mg/g creat — ABNORMAL HIGH (ref 0–29)
Microalbumin, Urine: 41 ug/mL

## 2019-10-18 MED FILL — LOSARTAN POTASSIUM 25 MG TA: 25 | 30 days supply | Qty: 30 | Fill #1

## 2019-10-18 NOTE — Progress Notes (Signed)
Internal Medicine Clinic Attending  Case discussed with Dr. Bloomfield  At the time of the visit.  We reviewed the resident's history and exam and pertinent patient test results.  I agree with the assessment, diagnosis, and plan of care documented in the resident's note.  

## 2019-10-20 ENCOUNTER — Ambulatory Visit
Admission: RE | Admit: 2019-10-20 | Discharge: 2019-10-20 | Disposition: A | Discharge status: Home / Self Care | Payer: 59 | Source: Ambulatory Visit | Attending: Student in an Organized Health Care Education/Training Program | Admitting: Student in an Organized Health Care Education/Training Program

## 2019-10-20 ENCOUNTER — Other Ambulatory Visit (HOSPITAL_COMMUNITY)
Admission: RE | Admit: 2019-10-20 | Discharge: 2019-10-20 | Disposition: A | Discharge status: Home / Self Care | Payer: 59 | Source: Ambulatory Visit | Attending: Physician Assistant | Admitting: Physician Assistant

## 2019-10-20 DIAGNOSIS — E010 Iodine-deficiency related diffuse (endemic) goiter: Secondary | ICD-10-CM | POA: Insufficient documentation

## 2019-10-21 ENCOUNTER — Other Ambulatory Visit: Payer: Self-pay | Admitting: Internal Medicine

## 2019-10-21 ENCOUNTER — Telehealth: Payer: Self-pay | Admitting: Internal Medicine

## 2019-10-21 DIAGNOSIS — E1169 Type 2 diabetes mellitus with other specified complication: Secondary | ICD-10-CM

## 2019-10-21 LAB — CYTOLOGY - NON PAP

## 2019-10-21 MED ORDER — METFORMIN HCL 1000 MG PO TABS: 1000.0000 mg | ORAL_TABLET | Freq: Two times a day (BID) | ORAL | 2 refills | Status: DC

## 2019-10-21 MED FILL — metFORMIN HCL 1000 MG TABS: 1000 | 30 days supply | Qty: 60 | Fill #0

## 2019-10-21 MED FILL — ONETOUCH DELICA PLUS LANCET: 30 days supply | Qty: 100 | Fill #0

## 2019-10-21 MED FILL — ONE TOUCH ULTRA TEST STRIPS: 25 days supply | Qty: 50 | Fill #0

## 2019-10-21 MED FILL — ONETOUCH ULTRA 2 W/DEVICE K: W/DEVICE | 1 days supply | Qty: 1 | Fill #0

## 2019-10-21 NOTE — Telephone Encounter (Signed)
Was able to reach Alyssa Buck on second attempt. Endorsed understanding that we are waiting for genetic testing to return before determining next steps.

## 2019-10-21 NOTE — Telephone Encounter (Signed)
Attempted to contact patient regarding results of thyroid biopsy and next steps. There was no answer and no voicemail box was set up to leave a secure message.

## 2019-11-08 ENCOUNTER — Encounter (HOSPITAL_COMMUNITY): Payer: Self-pay

## 2019-11-10 ENCOUNTER — Telehealth: Payer: Self-pay | Admitting: Internal Medicine

## 2019-11-10 NOTE — Telephone Encounter (Signed)
Alyssa Buck was called and updated on genetic testing results from thyroid biopsy which show that the nodule is benign (~4% chance of malignancy). She is comfortable with plan of annual ultrasounds for surveillance.

## 2019-11-17 ENCOUNTER — Other Ambulatory Visit: Payer: Self-pay | Admitting: Student

## 2019-11-17 ENCOUNTER — Other Ambulatory Visit: Payer: Self-pay | Admitting: Internal Medicine

## 2019-11-17 MED FILL — LOSARTAN POTASSIUM 25 MG TA: 25 | 90 days supply | Qty: 90 | Fill #0

## 2019-11-17 MED FILL — metFORMIN HCL 1000 MG TABS: 1000 | 30 days supply | Qty: 60 | Fill #1

## 2019-11-17 MED FILL — INVOKANA 100 MG TABLET: 100 | 30 days supply | Qty: 30 | Fill #1

## 2019-11-25 ENCOUNTER — Encounter: Payer: 59 | Admitting: Internal Medicine

## 2019-11-28 ENCOUNTER — Ambulatory Visit (INDEPENDENT_AMBULATORY_CARE_PROVIDER_SITE_OTHER): Payer: 59 | Admitting: Internal Medicine

## 2019-11-28 ENCOUNTER — Encounter: Payer: Self-pay | Admitting: Dietician

## 2019-11-28 ENCOUNTER — Ambulatory Visit (INDEPENDENT_AMBULATORY_CARE_PROVIDER_SITE_OTHER): Payer: 59 | Admitting: Dietician

## 2019-11-28 ENCOUNTER — Encounter: Payer: Self-pay | Admitting: Internal Medicine

## 2019-11-28 ENCOUNTER — Other Ambulatory Visit: Payer: Self-pay

## 2019-11-28 VITALS — BP 133/85 | HR 90 | Temp 98.2°F | Ht 68.0 in | Wt 194.6 lb

## 2019-11-28 DIAGNOSIS — E1169 Type 2 diabetes mellitus with other specified complication: Secondary | ICD-10-CM | POA: Diagnosis not present

## 2019-11-28 DIAGNOSIS — Z713 Dietary counseling and surveillance: Secondary | ICD-10-CM | POA: Diagnosis not present

## 2019-11-28 DIAGNOSIS — I10 Essential (primary) hypertension: Secondary | ICD-10-CM

## 2019-11-28 DIAGNOSIS — E042 Nontoxic multinodular goiter: Secondary | ICD-10-CM

## 2019-11-28 NOTE — Progress Notes (Signed)
   CC: F/u of DM  HPI:  [name] is a 49 y.o. female with PMHx as documented below, presented for DM follow up. Please refer to problem based charting for further details and assessment and plan of current problem and chronic medical conditions.  Past medical history: HTN, CVA, nodular thyroid, DM, bilateral leg edema,  Medications:  No past medical history on file. Review of Systems:   Review of Systems  Constitutional: Negative for chills and fever.  Respiratory: Negative for cough.   Cardiovascular: Negative for chest pain.    Physical Exam:  Vitals:   11/28/19 1310  BP: 133/85  Pulse: 90  Temp: 98.2 F (36.8 C)  TempSrc: Oral  SpO2: 100%  Weight: 194 lb 9.6 oz (88.3 kg)  Height: 5\' 8"  (1.727 m)   Physical Exam Constitutional:      General: She is not in acute distress.    Appearance: Normal appearance. She is not ill-appearing.  HENT:     Head: Normocephalic and atraumatic.  Eyes:     Extraocular Movements: Extraocular movements intact.  Cardiovascular:     Rate and Rhythm: Normal rate and regular rhythm.     Heart sounds: No murmur heard.   Pulmonary:     Effort: Pulmonary effort is normal.     Breath sounds: Normal breath sounds. No wheezing or rales.  Musculoskeletal:     Right lower leg: Edema present.     Left lower leg: Edema present.     Comments: Trace bilateral LEE  Neurological:     Mental Status: She is oriented to person, place, and time.  Psychiatric:        Mood and Affect: Mood normal.        Behavior: Behavior normal.        Thought Content: Thought content normal.        Judgment: Judgment normal.     Assessment & Plan:   See Encounters Tab for problem based charting.  Patient seen with Dr. 

## 2019-11-28 NOTE — Patient Instructions (Signed)
Thank you for allowing [place] to provide your care today. Today we discussed, your diabetes, blood pressure and your thyroid biopsy result. Please continue taking Metformin 1000 mg twice a day and Invokana 100 mg tablet once a day. We will check your hemoglobin A1c in a month and we will adjust your medications if needed then. As we talked, exercise and weight loss can improve your diabetes and blood pressure.  You will have an appointment with Ms. Lupita Leash today for diet commendation and lifestyle modification. Please check her blood sugar at home regularly and bring the meter with you next time. Today we made no changes to your medications.    Please come back to clinic in 1 month or earlier if needed. as always, if having severe symptoms, please seek medical attention at emergency room. Should you have any questions or concerns please call the internal medicine clinic at [phone].    Thank you!

## 2019-11-28 NOTE — Patient Instructions (Addendum)
Hi Alyssa Buck,  As I mentioned, we could not cover all diabetes topics today.  We talked about:   1- meal planning 2- what is diabetes 3- mediations 4- monitoring   I recommend that you schedule a follow up on same day that you  Meet with your doctor next time.  We did not cover physical activity, prevention of chronic complications(feet, teeth, eye, kidneys heart) or goal setting.  Also, I thought you might want to bring your meter with you so we can look at it.   Alyssa Buck [phone]

## 2019-11-28 NOTE — Progress Notes (Signed)
Diabetes Self-Management Education  Visit Type: First/Initial  Appt. Start Time: 1405 Appt. End Time: 1455  11/28/2019  [name], identified by name and date of birth, is a 49 y.o. female with a diagnosis of Diabetes: Type 2.   ASSESSMENT  There were no vitals taken for this visit. There is no height or weight on file to calculate BMI.   Diabetes Self-Management Education - 11/28/19 1500      Visit Information   Visit Type First/Initial      Initial Visit   Diabetes Type Type 2    Are you currently following a meal plan? Yes    What type of meal plan do you follow? limits carb portions    Are you taking your medications as prescribed? Yes      Psychosocial Assessment   Patient Belief/Attitude about Diabetes Motivated to manage diabetes    Self-care barriers None    Self-management support Doctor's office;Friends;CDE visits    Patient Concerns Healthy Lifestyle    Special Needs None    Preferred Learning Style No preference indicated    Learning Readiness Ready      Pre-Education Assessment   Patient understands the diabetes disease and treatment process. Demonstrates understanding / competency    Patient understands incorporating nutritional management into lifestyle. Demonstrates understanding / competency    Patient understands using medications safely. Needs Instruction    Patient understands monitoring blood glucose, interpreting and using results Needs Instruction    Patient understands prevention, detection, and treatment of acute complications. Needs Instruction    Patient understands how to develop strategies to address psychosocial issues. Demonstrates understanding / competency    Patient understands how to develop strategies to promote health/change behavior. Demonstrates understanding / competency      Complications   Last HgB A1C per patient/outside source 11.4 %    How often do you check your blood sugar? 3-4 times / week    Have you had a  dilated eye exam in the past 12 months? No      Dietary Intake   Breakfast cereal and milk 1-2%    Snack (afternoon) nuts, cranberries    Dinner spaghetti or lasgana    Beverage(s) juice, smoothies      Patient Education   Medications Reviewed patients medication for diabetes, action, purpose, timing of dose and side effects.    Monitoring Taught/evaluated SMBG meter.      Outcomes   Expected Outcomes Demonstrated interest in learning. Expect positive outcomes    Future DMSE 4-6 wks    Program Status Not Completed           Individualized Plan for Diabetes Self-Management Training:   Learning Objective:  Patient will have a greater understanding of diabetes self-management. Patient education plan is to attend individual and/or group sessions per assessed needs and concerns.   Plan:   Patient Instructions  Hi [name],  As I mentioned, we could not cover all diabetes topics today.  We talked about:   1- meal planning 2- what is diabetes 3- mediations 4- monitoring   I recommend that you schedule a follow up on same day that you  Meet with your doctor next time.  We did not cover physical activity, prevention of chronic complications(feet, teeth, eye, kidneys heart) or goal setting.  Also, I thought you might want to bring your meter with you so we can look at it.   Lupita Leash [phone]    Expected Outcomes:  Demonstrated interest in  learning. Expect positive outcomes  Education material provided: ADA - How to Thrive: A Guide for Your Journey with Diabetes and Carbohydrate counting sheet  If problems or questions, patient to contact team via:  Phone  Future DSME appointment: 4-6 wks  Norm Parcel, RD 11/28/2019 3:35 PM.

## 2019-11-29 NOTE — Assessment & Plan Note (Signed)
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  Was sent for further molecular testing that showd only 4% risk of malignancy.  Will need to repeat ultrasound in 12 months.

## 2019-11-29 NOTE — Assessment & Plan Note (Signed)
Blood pressure today is 133/85, heart rate 90.  Patient is on losartan 25 mg daily. Pressures unchanged from last visit. She reports her Blood pressure at home were around 128/80 but she just checked in once a week. Will continue current regimen.  -Continue losartan 25 mg daily -Life style modification including NASH diet. Has an appointment with Lupita Leash today

## 2019-11-29 NOTE — Assessment & Plan Note (Signed)
Last Hemoglobin A1c (09/29/2019) was11.4.  Started on Metformin and titrated up. Started Invokana 100 mg QD last visit (10/14/2019) . She had some loose stool but did not get worse since starting Metformin. Tolerated Invokana well.  She was not able to check BG recently but it was between 130-166 before. (2 weeks ago). The plan is having her check her BG more regularly and bring her meter with her next visit in a month. Will check HbA1c then. Urine MicroAlb/Cr ratio was slightly elevated (31). She is already on ARB.  -Continue Metformin 1000 mg twice daily and Invokana 100 mg QD -f/u in clinic in a month -She will see Alyssa Buck today for diet and BG check recommendation

## 2019-12-01 NOTE — Progress Notes (Signed)
Internal Medicine Clinic Attending  Case discussed with Dr. Masoudi  At the time of the visit.  We reviewed the resident's history and exam and pertinent patient test results.  I agree with the assessment, diagnosis, and plan of care documented in the resident's note.  

## 2019-12-16 ENCOUNTER — Other Ambulatory Visit: Payer: Self-pay | Admitting: Internal Medicine

## 2019-12-16 MED FILL — INVOKANA 100 MG TABLET: 100 | 30 days supply | Qty: 30 | Fill #0

## 2019-12-16 NOTE — Telephone Encounter (Signed)
Pt needs appt for DM recheck.

## 2019-12-16 NOTE — Telephone Encounter (Signed)
Please schedule pt an appt for diabetes f/u per Dr

## 2019-12-22 NOTE — Progress Notes (Addendum)
Office Visit   Patient ID: Alyssa Buck, female    DOB: May 06, 1970, 49 y.o.   MRN: [id number]  Subjective:  CC: type 2 DM, chronic diarrhea (new)   HPI 49 y.o. presents today for follow up of her type 2 DM.  Last seen by [facility] on 11/28/19 for this, however she did not bring her glucometer with her to that visit. Dr. Sydnee Levans recommended that she check her glucose frequently at home and to follow up in the clinic in one month.   Today (9/13):   DIABETES TYPE 2 FOLLOW-UP: Anti-hyperglycemic agents: metformin 1034m BID, invokana 1085mdaily Secondary agents: none Last A1C: 11.4 (09/2019) A1C today: 6.7 Have you taken medication(s) today: yes Med Adherence:  _0  Yes    _1  No Medication side effects:  _2  Yes    _3  No  Home Monitoring?  _4  Yes    _5  No Home glucose results range: 120-150 Diet Adherence: _6  Yes    _7  No Exercise: _8  Yes    _9  No Hypoglycemic episodes?: none Numbness of the feet? _10  Yes    _11  No Retinopathy hx? _12  Yes    _13  No Last eye exam:never Last foot exam negative within the past year.  She is also reporting a 1 year history of noctural diarrhea associated with abdominal discomfort and bloating. The diarrhea begins around 2000 and requires her to get up to the bathroom about every 2h at night. The stool is liquid. The stool is not oily or fatty in appearance. She denies hematochezia or melena. She eats around 4 hours prior to onset of symptoms. She has tried removing several different things from her diet including fatty foods, gluten, dairy, without relief of symptoms. She does think that greens such as lettuce or cucumbers make her sx worse, but even when she tried removing these from her diet, she still experienced diarrhea every 2h at night.  She denies systemic symptoms such as fever, fatigue, night sweats, easy bruising, or joint pain. Denies change in appetite. She has had weight loss however this has been intentional through diet and  exercise.  She denies personal of family history of celiac disease, ulcerative colitis or chron's disease. No personal or family history of colon cancer. No personal or family history of similar type symptoms.   [place] sources (lakes/rivers[place]   She works as a [employer] production planner She has had 3 cesarean sections but no other intraabdominal surgeries.           ACTIVE MEDICATIONS   Current Outpatient Medications on File Prior to Visit  Medication Sig Dispense Refill  . Blood Glucose Monitoring Suppl (CONTOUR NEXT MONITOR) w/Device KIT 1 application by Does not apply route in the morning and at bedtime. Use to check blood sugar twice daily 1 kit 0  . glucose blood (CONTOUR NEXT TEST) test strip Test blood sugar twice daily 75 each 5  . Lancets [id number] 1 applicator by Does not apply route 2 (two) times daily. 100 each 4  . losartan (COZAAR) 25 MG tablet TAKE 1 TABLET BY MOUTH ONCE DAILY. 90 tablet 1   No current facility-administered medications on file prior to visit.    ROS  Review of Systems  Constitutional: Negative for appetite change, fatigue, fever and unexpected weight change.  Gastrointestinal: Positive for abdominal distention, abdominal pain and diarrhea. Negative for blood in stool, constipation, nausea and vomiting.  Musculoskeletal: Negative for arthralgias, joint swelling  and myalgias.  Allergic/Immunologic: Negative for food allergies.  Hematological: Does not bruise/bleed easily.    Objective:   BP 107/68 (BP Location: Left Arm, Patient Position: Sitting)   Pulse 92   Wt 193 lb 14.4 oz (88 kg)   SpO2 100%   BMI 29.48 kg/m  Wt Readings from Last 3 Encounters:  12/26/19 193 lb 14.4 oz (88 kg)  11/28/19 194 lb 9.6 oz (88.3 kg)  10/14/19 192 lb 14.4 oz (87.5 kg)   BP Readings from Last 3 Encounters:  12/26/19 107/68  11/28/19 133/85  10/14/19 136/89   Physical Exam Constitutional:      General: She  is not in acute distress.    Appearance: Normal appearance.  Cardiovascular:     Rate and Rhythm: Normal rate.  Pulmonary:     Effort: Pulmonary effort is normal.  Abdominal:     General: Bowel sounds are normal. There is no distension.     Palpations: Abdomen is soft. There is no hepatomegaly, splenomegaly or mass.     Tenderness: There is no abdominal tenderness. There is no guarding.  Musculoskeletal:        General: No swelling.  Skin:    Findings: No bruising or rash.     Assessment & Plan:   Problem List Items Addressed This Visit      Endocrine   Type 2 diabetes mellitus ([place])    A1C 6.7 today--down from 11.4 in June 2021. She has been working hard on diet and exercise and has been taking her metformin and invokana consistently.  Plan --continue invokana 185m daily, metformin 10064mBID--refills sent to pharmacy --referral to ophthalmology for retinal exam  --f/u in 3 mo for A1C recheck --consider obtaining lipid panel at next visit to calculate ASCVD risk      Relevant Medications   metFORMIN (GLUCOPHAGE) 1000 MG tablet   canagliflozin (INVOKANA) 100 MG TABS tablet   Other Relevant Orders   POC Hbg A1C (Completed)   Glucose, capillary (Completed)   Ambulatory referral to Ophthalmology     Other   Nocturnal diarrhea - Primary (Chronic)    1y history of nocturnal diarrhea. Broad ddx including inflammatory, infection, bacterial overgrowth, medication, or malignancy or endocrine.  My initial thought was that this was related to her starting metformin however after further discussion with her, she notes that this was occurring prior to starting metformin and did not worsen after starting it. Her only other medications are invokana and losartan which would be less likely to cause this.  She does have a history of a goiter and I considered hyperthyroidism however TSH was just checked a few months ago and was normal.   Infectious etiology has been considered however,  given the chronicity and temporal nature, many common gastroenteritis infections can be considered less likely. She does work as a pr[employer] production plannerowever I do not think that something like rotavirus would last this long.   She does have diabetes that is now well controlled however bacterial overgrowth can not be ruled out at this time.  Malignancy is a consideration however given the lack of family history of early cancers and lack of other symptoms, I am considering this less likely.  Right now, I am leaning toward an inflammatory etiology which could include an array of things from autoimmune to allergy to microscopic colitis. I have started a workup for chronic diarrhea at today's visit including ESR, CRP, celiac panel, ferritin, HIV, HCV, fecal occult, and fecal calprotectin. Right now,  ESR is the only one to have resulted and is elevated at 36 which is consistent with inflammation. Plan --f/u in 2w for review of labs --the noctural diarrhea is considered a red flag so consider GI referral once today's labs return. She will likely need a colonoscopy which we discussed at today's visit and she is agreeable to  Addendum 12/29/2019: CRP, celiac panel, HIV, HCV, fecal occult all unremarkable. ESR mildly elevated at 36. Ferritin also mildly elevated. Plan: GI referral placed. MyChart message sent to pt.        Relevant Orders   Sed Rate (ESR) (Completed)   CRP (C-Reactive Protein) (Completed)   Iron, TIBC and Ferritin Panel (Completed)   Celiac Disease Panel (Completed)   Calprotectin, Fecal   Fecal occult blood, imunochemical (Completed)    Other Visit Diagnoses    Screening for HIV (human immunodeficiency virus)       Relevant Orders   HIV antibody (with reflex) (Completed)   Encounter for HCV screening test for low risk patient       Relevant Orders   Hepatitis C antibody (Completed)        Pt discussed with Dr. Clista Bernhardt, MD Internal Medicine Resident  PGY-2 [facility] Pager: [phone] 12/29/2019 8:36 AM

## 2019-12-22 NOTE — Assessment & Plan Note (Addendum)
A1C 6.7 today--down from 11.4 in June 2021. She has been working hard on diet and exercise and has been taking her metformin and invokana consistently.  Plan --continue invokana 100mg  daily, metformin 1000mg  BID--refills sent to pharmacy --referral to ophthalmology for retinal exam  --f/u in 3 mo for A1C recheck --consider obtaining lipid panel at next visit to calculate ASCVD risk

## 2019-12-26 ENCOUNTER — Other Ambulatory Visit: Payer: Self-pay

## 2019-12-26 ENCOUNTER — Ambulatory Visit (INDEPENDENT_AMBULATORY_CARE_PROVIDER_SITE_OTHER): Payer: 59 | Admitting: Internal Medicine

## 2019-12-26 VITALS — BP 107/68 | HR 92 | Wt 193.9 lb

## 2019-12-26 DIAGNOSIS — Z1159 Encounter for screening for other viral diseases: Secondary | ICD-10-CM

## 2019-12-26 DIAGNOSIS — R197 Diarrhea, unspecified: Secondary | ICD-10-CM

## 2019-12-26 DIAGNOSIS — K529 Noninfective gastroenteritis and colitis, unspecified: Secondary | ICD-10-CM

## 2019-12-26 DIAGNOSIS — Z114 Encounter for screening for human immunodeficiency virus [HIV]: Secondary | ICD-10-CM

## 2019-12-26 DIAGNOSIS — E1169 Type 2 diabetes mellitus with other specified complication: Secondary | ICD-10-CM | POA: Diagnosis not present

## 2019-12-26 LAB — POCT GLYCOSYLATED HEMOGLOBIN (HGB A1C): Hemoglobin A1C: 6.7 % — AB (ref 4.0–5.6)

## 2019-12-26 LAB — GLUCOSE, CAPILLARY: Glucose-Capillary: 84 mg/dL (ref 70–99)

## 2019-12-26 NOTE — Patient Instructions (Signed)
It was great meeting you today!  GREAT JOB on bringing down your A1C. It went down from 11.4 to 6.7. Keep doing what you are doing and schedule an appointment for another appointment in 3 months when we can recheck your A1C.  In regards to your diarrhea, we will get some labs and a stool sample. I would like you to come back in 2 weeks so we can discuss those results and place further recommendations.  Take care!

## 2019-12-27 ENCOUNTER — Other Ambulatory Visit: Payer: 59

## 2019-12-27 DIAGNOSIS — R197 Diarrhea, unspecified: Secondary | ICD-10-CM | POA: Insufficient documentation

## 2019-12-27 MED ORDER — CANAGLIFLOZIN 100 MG PO TABS
ORAL_TABLET | ORAL | 1 refills | Status: DC
Start: 2019-12-27 — End: 2020-02-17

## 2019-12-27 MED ORDER — METFORMIN HCL 1000 MG PO TABS: 1000.0000 mg | ORAL_TABLET | Freq: Two times a day (BID) | ORAL | 2 refills | Status: DC

## 2019-12-27 NOTE — Assessment & Plan Note (Addendum)
1y history of nocturnal diarrhea. Broad ddx including inflammatory, infection, bacterial overgrowth, medication, or malignancy or endocrine.  My initial thought was that this was related to her starting metformin however after further discussion with her, she notes that this was occurring prior to starting metformin and did not worsen after starting it. Her only other medications are invokana and losartan which would be less likely to cause this.  She does have a history of a goiter and I considered hyperthyroidism however TSH was just checked a few months ago and was normal.   Infectious etiology has been considered however, given the chronicity and temporal nature, many common gastroenteritis infections can be considered less likely. 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   She does have diabetes that is now well controlled however bacterial overgrowth can not be ruled out at this time.  Malignancy is a consideration however given the lack of family history of early cancers and lack of other symptoms, I am considering this less likely.  Right now, I am leaning toward an inflammatory etiology which could include an array of things from autoimmune to allergy to microscopic colitis. I have started a workup for chronic diarrhea at today's visit including ESR, CRP, celiac panel, ferritin, HIV, HCV, fecal occult, and fecal calprotectin. Right now, ESR is the only one to have resulted and is elevated at 36 which is consistent with inflammation. Plan --f/u in 2w for review of labs --the noctural diarrhea is considered a red flag so consider GI referral once today's labs return. She will likely need a colonoscopy which we discussed at today's visit and she is agreeable to  Addendum 12/29/2019: CRP, celiac panel, HIV, HCV, fecal occult all unremarkable. ESR mildly elevated at 36. Ferritin also mildly elevated. Plan: GI referral placed.  MyChart message sent to pt.

## 2019-12-27 NOTE — Progress Notes (Signed)
Internal Medicine Clinic Attending  Case discussed with Dr. Christian at the time of the visit.  We reviewed the resident's history and exam and pertinent patient test results.  I agree with the assessment, diagnosis, and plan of care documented in the resident's note.  Karalyn Kadel, M.D., Ph.D.  

## 2019-12-28 LAB — SEDIMENTATION RATE: Sed Rate: 36 mm/hr — ABNORMAL HIGH (ref 0–32)

## 2019-12-28 LAB — IRON,TIBC AND FERRITIN PANEL
Ferritin: 167 ng/mL — ABNORMAL HIGH (ref 15–150)
Iron Saturation: 32 % (ref 15–55)
Iron: 101 ug/dL (ref 27–159)
Total Iron Binding Capacity: 320 ug/dL (ref 250–450)
UIBC: 219 ug/dL (ref 131–425)

## 2019-12-28 LAB — HIV ANTIBODY (ROUTINE TESTING W REFLEX): HIV Screen 4th Generation wRfx: NONREACTIVE

## 2019-12-28 LAB — CELIAC DISEASE PANEL
Endomysial IgA: NEGATIVE
IgA/Immunoglobulin A, Serum: 273 mg/dL (ref 87–352)
Transglutaminase IgA: <2 U/mL (ref 0–3)

## 2019-12-28 LAB — C-REACTIVE PROTEIN: CRP: <1 mg/L (ref 0–10)

## 2019-12-28 LAB — HEPATITIS C ANTIBODY: Hep C Virus Ab: <0.1 s/co ratio (ref 0.0–0.9)

## 2019-12-29 ENCOUNTER — Other Ambulatory Visit: Payer: Self-pay | Admitting: Internal Medicine

## 2019-12-29 ENCOUNTER — Encounter: Payer: Self-pay | Admitting: Internal Medicine

## 2019-12-29 DIAGNOSIS — R197 Diarrhea, unspecified: Secondary | ICD-10-CM

## 2019-12-29 LAB — CALPROTECTIN, FECAL: Calprotectin, Fecal: <16 ug/g (ref 0–120)

## 2019-12-29 LAB — FECAL OCCULT BLOOD, IMMUNOCHEMICAL: Fecal Occult Bld: NEGATIVE

## 2020-01-03 ENCOUNTER — Encounter: Payer: Self-pay | Admitting: Gastroenterology

## 2020-01-09 ENCOUNTER — Encounter: Payer: 59 | Admitting: Internal Medicine

## 2020-01-13 MED FILL — ONE TOUCH ULTRA TEST STRIPS: 25 days supply | Qty: 50 | Fill #1

## 2020-01-20 MED FILL — INVOKANA 100 MG TABLET: 100 | 30 days supply | Qty: 30 | Fill #1

## 2020-02-01 LAB — HM DIABETES EYE EXAM

## 2020-02-02 ENCOUNTER — Encounter: Payer: Self-pay | Admitting: *Deleted

## 2020-02-07 DIAGNOSIS — H2513 Age-related nuclear cataract, bilateral: Secondary | ICD-10-CM | POA: Diagnosis not present

## 2020-02-07 DIAGNOSIS — E113413 Type 2 diabetes mellitus with severe nonproliferative diabetic retinopathy with macular edema, bilateral: Secondary | ICD-10-CM | POA: Diagnosis not present

## 2020-02-07 DIAGNOSIS — H35033 Hypertensive retinopathy, bilateral: Secondary | ICD-10-CM | POA: Diagnosis not present

## 2020-02-07 DIAGNOSIS — H43822 Vitreomacular adhesion, left eye: Secondary | ICD-10-CM | POA: Diagnosis not present

## 2020-02-17 ENCOUNTER — Telehealth: Payer: Self-pay | Admitting: Internal Medicine

## 2020-02-17 MED FILL — LOSARTAN POTASSIUM 25 MG TA: 25 | 90 days supply | Qty: 90 | Fill #1

## 2020-02-20 ENCOUNTER — Telehealth: Payer: Self-pay | Admitting: *Deleted

## 2020-02-20 DIAGNOSIS — E1169 Type 2 diabetes mellitus with other specified complication: Secondary | ICD-10-CM

## 2020-02-20 NOTE — Telephone Encounter (Signed)
Information was sent through CoverMyMeds to [facility] for PA for Invokana.  Awaitng determination. Patient may need to try and fail the preferred drug of Faxiga.  Message to be sent to the [facility] Team to consider a change if indicated.  Angelina Ok, RN 02/20/2020 8:57 AM. Message from [facility] patient will need to try and Fail Faxiga or Xigduo.  Angelina Ok, RN 02/21/2020 2:25 PM.

## 2020-02-20 NOTE — Telephone Encounter (Signed)
Just spoke with patient and she agreed to an appointment with Dr. Maryla Morrow on 03/05/2020 at 1:45 pm.

## 2020-02-21 ENCOUNTER — Ambulatory Visit: Payer: BC Managed Care – PPO | Admitting: Gastroenterology

## 2020-02-21 ENCOUNTER — Other Ambulatory Visit: Payer: Self-pay | Admitting: Gastroenterology

## 2020-02-21 ENCOUNTER — Encounter: Payer: Self-pay | Admitting: Gastroenterology

## 2020-02-21 VITALS — BP 118/74 | HR 85 | Ht 68.0 in | Wt 193.5 lb

## 2020-02-21 DIAGNOSIS — K529 Noninfective gastroenteritis and colitis, unspecified: Secondary | ICD-10-CM | POA: Diagnosis not present

## 2020-02-21 DIAGNOSIS — R140 Abdominal distension (gaseous): Secondary | ICD-10-CM | POA: Diagnosis not present

## 2020-02-21 DIAGNOSIS — Z1211 Encounter for screening for malignant neoplasm of colon: Secondary | ICD-10-CM | POA: Diagnosis not present

## 2020-02-21 MED ORDER — PLENVU 140 G PO SOLR: 140.0000 g | ORAL | 0 refills | Status: DC

## 2020-02-21 MED FILL — PLENVU 140 GM SOLR: 140 | 1 days supply | Qty: 3 | Fill #0

## 2020-02-21 NOTE — Patient Instructions (Addendum)
If you are age 47 or older, your body mass index should be between 23-30. Your Body mass index is 29.42 kg/m. If this is out of the aforementioned range listed, please consider follow up with your Primary Care Provider.  If you are age 72 or younger, your body mass index should be between 19-25. Your Body mass index is 29.42 kg/m. If this is out of the aformentioned range listed, please consider follow up with your Primary Care Provider.   You have been scheduled for a colonoscopy. Please follow written instructions given to you at your visit today.  Please pick up your prep supplies at the pharmacy within the next 1-3 days. If you use inhalers (even only as needed), please bring them with you on the day of your procedure.   It was a pleasure to see you today!  Dr. Myrtie Neither  Food Guidelines for those with chronic digestive trouble:  Many people have difficulty digesting certain foods, causing a variety of distressing and embarrassing symptoms such as abdominal pain, bloating and gas.  These foods may need to be avoided or consumed in small amounts.  Here are some tips that might be helpful for you.  1.   Lactose intolerance is the difficulty or complete inability to digest lactose, the natural sugar in milk and anything made from milk.  This condition is harmless, common, and can begin any time during life.  Some people can digest a modest amount of lactose while others cannot tolerate any.  Also, not all dairy products contain equal amounts of lactose.  For example, hard cheeses such as parmesan have less lactose than soft cheeses such as cheddar.  Yogurt has less lactose than milk or cheese.  Many packaged foods (even many brands of bread) have milk, so read ingredient lists carefully.  It is difficult to test for lactose intolerance, so just try avoiding lactose as much as possible for a week and see what happens with your symptoms.  If you seem to be lactose intolerant, the best plan is to avoid  it (but make sure you get calcium from another source).  The next best thing is to use lactase enzyme supplements, available over the counter everywhere.  Just know that many lactose intolerant people need to take several tablets with each serving of dairy to avoid symptoms.  Lastly, a lot of restaurant food is made with milk or butter.  Many are things you might not suspect, such as mashed potatoes, rice and pasta (cooked with butter) and "grilled" items.  If you are lactose intolerant, it never hurts to ask your server what has milk or butter.  2.   Fiber is an important part of your diet, but not all fiber is well-tolerated.  Insoluble fiber such as bran is often consumed by normal gut bacteria and converted into gas.  Soluble fiber such as oats, squash, carrots and green beans are typically tolerated better.  3.   Some types of carbohydrates can be poorly digested.  Examples include: fructose (apples, cherries, pears, raisins and other dried fruits), fructans (onions, zucchini, large amounts of wheat), sorbitol/mannitol/xylitol and sucralose/Splenda (common artificial sweeteners), and raffinose (lentils, broccoli, cabbage, asparagus, brussel sprouts, many types of beans).  Do a Programmer, multimedia for [employer] [place] and you will find helpful information. Beano, a dietary supplement, will often help with raffinose-containing foods.  As with lactase tablets, you may need several per serving.  4.   Whenever possible, avoid processed food&meats and chemical additives.  High  fructose corn syrup, a common sweetener, may be difficult to digest.  Eggs and soy (comes from the soybean, and added to many foods now) are other common bloating/gassy foods.  5.  Regarding gluten:  gluten is a protein mainly found in wheat, but also rye and barley.  There is a condition called celiac sprue, which is an inflammatory reaction in the small intestine causing a variety of digestive symptoms.  Blood testing is highly reliable to look  for this condition, and sometimes upper endoscopy with small bowel biopsies may be necessary to make the diagnosis.  Many patients who test negative for celiac sprue report improvement in their digestive symptoms when they switch to a gluten-free diet.  However, in these "non-celiac gluten sensitive" patients, the true role of gluten in their symptoms is unclear.  Reducing carbohydrates in general may decrease the gas and bloating caused when gut bacteria consume carbs. Also, some of these patients may actually be intolerant of the baker's yeast in bread products rather than the gluten.  Flatbread and other reduced yeast breads might therefore be tolerated.  There is no specific testing available for most food intolerances, which are discovered mainly by dietary elimination.  Please do not embark on a gluten free diet unless directed by your doctor, as it is highly restrictive, and may lead to nutritional deficiencies if not carefully monitored.  Lastly, beware of internet claims offering "personalized" tests for food intolerances.  Such testing has no reliable scientific evidence to support its reliability and correlation to symptoms.    6.  The best advice is old advice, especially for those with chronic digestive trouble - try to eat "clean".  Balanced diet, avoid processed food, plenty of fruits and vegetables, cut down the sugar, minimal alcohol, avoid tobacco. Make time to care for yourself, get enough sleep, exercise when you can, reduce stress.  Your guts will thank you for it.   - Dr. Sherlynn [facility]

## 2020-02-21 NOTE — Progress Notes (Signed)
[facility] Consult Note:  History: Alyssa Buck 02/21/2020  Referring provider: Dewayne Hatch, MD  Reason for consult/chief complaint: Gas (Sour gas and berps )   Subjective  HPI: ____________________________________  From PCP note 12/26/19: "She is also reporting a 1 year history of noctural diarrhea associated with abdominal discomfort and bloating. The diarrhea begins around 2000 and requires her to get up to the bathroom about every 2h at night. The stool is liquid. The stool is not oily or fatty in appearance. She denies hematochezia or melena. She eats around 4 hours prior to onset of symptoms. She has tried removing several different things from her diet including fatty foods, gluten, dairy, without relief of symptoms. She does think that greens such as lettuce or cucumbers make her sx worse, but even when she tried removing these from her diet, she still experienced diarrhea every 2h at night.  She denies systemic symptoms such as fever, fatigue, night sweats, easy bruising, or joint pain. Denies change in appetite. She has had weight loss however this has been intentional through diet and exercise.   She denies personal of family history of celiac disease, ulcerative colitis or chron's disease. No personal or family history of colon cancer. No personal or family history of similar type symptoms. "  ____________________________  Alyssa Buck was referred by primary care for chronic diarrhea.  She is a very good historian, and tells me that a few years back she had elevated glucose that got better after weight loss and she did not need to be on treatment.  Earlier this year she was found to be diabetic with hemoglobin A1c over 11 as noted below, and she was put on Metformin.  Prior to Metformin she would have a regular formed BM 2-3 times per day.  Metformin then cause a significant change with nocturnal diarrhea every couple of hours with urgency.  That  resolved when the medicine was stopped after mid-September primary care visit.  She has always tended to have some intermittent nausea or loose stool during her menstrual cycle. She has occasional bloating and sour belching.  With some dietary changes of diabetes with more vegetable intake she has noticed some bloating and gas.  Denies rectal bleeding.  No prior GI evaluation or colon cancer screening.  ROS:  Review of Systems  Constitutional: Negative for appetite change and unexpected weight change.  HENT: Negative for mouth sores and voice change.   Eyes: Negative for pain and redness.  Respiratory: Negative for cough and shortness of breath.   Cardiovascular: Negative for chest pain and palpitations.  Genitourinary: Negative for dysuria and hematuria.  Musculoskeletal: Negative for arthralgias and myalgias.  Skin: Negative for pallor and rash.  Neurological: Negative for weakness and headaches.  Hematological: Negative for adenopathy.     Past Medical History: Past Medical History:  Diagnosis Date  . Diabetes ([place])   . Hypertension      Past Surgical History: Past Surgical History:  Procedure Laterality Date  . BREAST REDUCTION SURGERY    . [place], 2001  . NOVASURE ABLATION  2005   abnormal uterine bleeding     Family History: Family History  Problem Relation Age of Onset  . Hypertension Mother   . Hypertension Father   . Diabetes Mellitus II Father   . Hypertension Maternal Grandmother   . Heart failure Maternal Grandmother   . Lymphoma Maternal Grandmother        big lymph  nodes   . Hypertension Paternal Grandmother   . Breast cancer Maternal Aunt   . Thyroid disease Paternal Aunt   . Colon cancer Neg Hx   . Stomach cancer Neg Hx   . Esophageal cancer Neg Hx   . Pancreatic cancer Neg Hx     Social History: Social History   Socioeconomic History  . Marital status: Married    Spouse name: Not on file  . Number of children:  Not on file  . Years of education: Not on file  . Highest education level: Not on file  Occupational History  . Occupation: early childhood educator  Tobacco Use  . Smoking status: Never Smoker  . Smokeless tobacco: Never Used  Vaping Use  . Vaping Use: Never used  Substance and Sexual Activity  . Alcohol use: Not Currently  . Drug use: Never  . Sexual activity: Not on file  Other Topics Concern  . Not on file  Social History Narrative  . Not on file   Social Determinants of Health   Financial Resource Strain:   . Difficulty of Paying Living Expenses: Not on file  Food Insecurity:   . Worried About Charity fundraiser in the Last Year: Not on file  . Ran Out of Food in the Last Year: Not on file  Transportation Needs:   . Lack of Transportation (Medical): Not on file  . Lack of Transportation (Non-Medical): Not on file  Physical Activity:   . Days of Exercise per Week: Not on file  . Minutes of Exercise per Session: Not on file  Stress:   . Feeling of Stress : Not on file  Social Connections:   . Frequency of Communication with Friends and Family: Not on file  . Frequency of Social Gatherings with Friends and Family: Not on file  . Attends Religious Services: Not on file  . Active Member of Clubs or Organizations: Not on file  . Attends Archivist Meetings: Not on file  . 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 Lisinopril Cough    Cough, flushing, nausea    Outpatient Meds: Current Outpatient Medications  Medication Sig Dispense Refill  . Blood Glucose Monitoring Suppl (CONTOUR NEXT MONITOR) w/Device KIT 1 application by Does not apply route in the morning and at bedtime. Use to check blood sugar twice daily 1 kit 0  . glucose blood (CONTOUR NEXT TEST) test strip Test blood sugar twice daily 75 each 5  . INVOKANA 100 MG TABS tablet TAKE 1 TABLET (100  MG TOTAL) BY MOUTH DAILY BEFORE BREAKFAST. 30 tablet 3  . Lancets 65K MISC 1 applicator by Does not apply route 2 (two) times daily. 100 each 4  . losartan (COZAAR) 25 MG tablet TAKE 1 TABLET BY MOUTH ONCE DAILY. 90 tablet 1   No current facility-administered medications for this visit.      ___________________________________________________________________ Objective   Exam:  BP 118/74   Pulse 85   Ht 5' 8" (1.727 m)   Wt 193 lb 8 oz (87.8 kg)   BMI 29.42 kg/m    General: Well-appearing  Eyes: sclera anicteric, no redness  ENT: oral mucosa moist without lesions, no cervical or supraclavicular lymphadenopathy  CV: RRR without murmur, S1/S2, no JVD, no peripheral edema  Resp: clear to auscultation bilaterally, normal RR and effort noted  GI: soft, no tenderness, with active bowel  sounds. No guarding or palpable organomegaly noted.  Skin; warm and dry, no rash or jaundice noted  Neuro: awake, alert and oriented x 3. Normal gross motor function and fluent speech  Labs:  12/27/2019:  Fecal calprotectin negative, FOBT negative CRP negative, ESR 36 Hemoglobin A1c 6.7  (was 11.4 on 09/29/19) Negative TTG IgA antibody, normal total IgA level  CMP Latest Ref Rng & Units 09/07/2019 08/29/2019  Glucose 65 - 99 mg/dL 379(H) 232(H)  BUN 6 - 24 mg/dL 17 9  Creatinine 0.57 - 1.00 mg/dL 0.61 0.46(L)  Sodium 134 - 144 mmol/L 134 136  Potassium 3.5 - 5.2 mmol/L 3.3(L) 3.5  Chloride 96 - 106 mmol/L 96 98  CO2 20 - 29 mmol/L 24 25  Calcium 8.7 - 10.2 mg/dL 9.1 9.0    Assessment: Encounter Diagnoses  Name Primary?  . Chronic diarrhea Yes  . Abdominal bloating   . Special screening for malignant neoplasms, colon     Chronic diarrhea bloating and gas that improved considerably after stopping Metformin.  She still has some residual symptoms with dietary triggers. No nausea and vomiting, but belching suggests possible intermittent delayed emptying from diabetes that is now under  better control in recent months. Stool negative for occult blood and inflammatory markers, celiac labs negative.  Plan:  Written dietary advice given regarding bloating and gas  Screening colonoscopy.  She was agreeable after discussion of procedure and risks.  The benefits and risks of the planned procedure were described in detail with the patient or (when appropriate) their health care proxy.  Risks were outlined as including, but not limited to, bleeding, infection, perforation, adverse medication reaction leading to cardiac or pulmonary decompensation, pancreatitis (if ERCP).  The limitation of incomplete mucosal visualization was also discussed.  No guarantees or warranties were given. (Take biopsies to rule out microscopic colitis at that time)  Thank you for the courtesy of this consult.  Please call me with any questions or concerns.  Nelida Meuse III  CC: Referring provider noted above

## 2020-02-22 MED ORDER — DAPAGLIFLOZIN PROPANEDIOL 5 MG PO TABS: 5.0000 mg | ORAL_TABLET | Freq: Every day | ORAL | 5 refills | Status: DC

## 2020-02-22 NOTE — Telephone Encounter (Signed)
Spoke with patient and informed her of the switch. She is agreeable and still stop Invokana and start [place].

## 2020-02-22 NOTE — Telephone Encounter (Signed)
Have attempted to call patient x2, voicemail is not set up. Will try again later. I am switching her to [place] due to insurance.

## 2020-02-22 NOTE — Addendum Note (Signed)
Addended by: Remo Lipps on: 02/22/2020 10:17 AM   Modules accepted: Orders

## 2020-02-22 NOTE — Assessment & Plan Note (Signed)
Received message from [facility] that Alyssa Buck is not on their preferred formulary, and the patient would need to try and fail [place] before Invokana would be approved.   -switch Invokana to [place] 5mg  daily

## 2020-02-23 ENCOUNTER — Other Ambulatory Visit: Payer: Self-pay | Admitting: Student

## 2020-02-23 MED ORDER — DAPAGLIFLOZIN PROPANEDIOL 5 MG PO TABS: 5.0000 mg | ORAL_TABLET | Freq: Every day | ORAL | 5 refills | Status: DC

## 2020-02-23 MED FILL — FARXIGA 5 MG TABLET: 5 | 30 days supply | Qty: 30 | Fill #0

## 2020-02-23 NOTE — Addendum Note (Signed)
Addended by: Remo Lipps on: 02/23/2020 08:43 AM   Modules accepted: Orders

## 2020-03-05 ENCOUNTER — Other Ambulatory Visit: Payer: Self-pay | Admitting: Internal Medicine

## 2020-03-05 ENCOUNTER — Ambulatory Visit: Payer: BC Managed Care – PPO | Admitting: Internal Medicine

## 2020-03-05 ENCOUNTER — Telehealth: Payer: Self-pay | Admitting: Dietician

## 2020-03-05 ENCOUNTER — Other Ambulatory Visit: Payer: Self-pay

## 2020-03-05 DIAGNOSIS — I10 Essential (primary) hypertension: Secondary | ICD-10-CM

## 2020-03-05 DIAGNOSIS — E1169 Type 2 diabetes mellitus with other specified complication: Secondary | ICD-10-CM

## 2020-03-05 MED ORDER — CONTOUR NEXT MONITOR W/DEVICE KIT: PACK | 1 refills | Status: DC

## 2020-03-05 MED ORDER — MICROLET LANCETS MISC: 7 refills | Status: DC

## 2020-03-05 MED FILL — MICROLET LANCETS MISC: 50 days supply | Qty: 100 | Fill #1

## 2020-03-05 MED FILL — CONTOUR NEXT STRIPS: 25 days supply | Qty: 50 | Fill #2

## 2020-03-05 NOTE — Telephone Encounter (Signed)
Patient presented to office requesting testing supplies. We called pharmacy who requested prescription for meter and lancets.

## 2020-03-05 NOTE — Progress Notes (Signed)
Established Patient Office Visit  Subjective:  Patient ID: Alyssa Buck, female    DOB: [birth year]  Age: 49 y.o. MRN: [id number]  CC: DM follow-up Chief Complaint  Patient presents with  . routine follow up    HPI Alyssa Buck presents for DM follow-up.  Please refer to problem based charting for further details and assessment and plan.  Past medical history: HTN, multinodular thyroid, DM2, diarrhea, bilateral leg edema  Medications: Farxiga, losartan  Past Medical History:  Diagnosis Date  . Diabetes ([place])   . Hypertension     Past Surgical History:  Procedure Laterality Date  . BREAST REDUCTION SURGERY    . [place], 2001  . [place] ABLATION  2005   abnormal uterine bleeding    Family History  Problem Relation Age of Onset  . Hypertension Mother   . Hypertension Father   . Diabetes Mellitus II Father   . Hypertension Maternal Grandmother   . Heart failure Maternal Grandmother   . Lymphoma Maternal Grandmother        big lymph nodes   . Hypertension Paternal Grandmother   . Breast cancer Maternal Aunt   . Thyroid disease Paternal Aunt   . Colon cancer Neg Hx   . Stomach cancer Neg Hx   . Esophageal cancer Neg Hx   . Pancreatic cancer Neg Hx     Social History   Socioeconomic History  . Marital status: Married    Spouse name: Not on file  . Number of children: Not on file  . Years of education: Not on file  . Highest education level: Not on file  Occupational History  . Occupation: early childhood educator  Tobacco Use  . Smoking status: Never Smoker  . Smokeless tobacco: Never Used  Vaping Use  . Vaping Use: Never used  Substance and Sexual Activity  . Alcohol use: Not Currently  . Drug use: Never  . Sexual activity: Not on file  Other Topics Concern  . Not on file  Social History Narrative  . Not on file   Social Determinants of Health   Financial Resource Strain:   . Difficulty of Paying Living  Expenses: Not on file  Food Insecurity:   . Worried About Charity fundraiser in the Last Year: Not on file  . Ran Out of Food in the Last Year: Not on file  Transportation Needs:   . Lack of Transportation (Medical): Not on file  . Lack of Transportation (Non-Medical): Not on file  Physical Activity:   . Days of Exercise per Week: Not on file  . Minutes of Exercise per Session: Not on file  Stress:   . Feeling of Stress : Not on file  Social Connections:   . Frequency of Communication with Friends and Family: Not on file  . Frequency of Social Gatherings with Friends and Family: Not on file  . Attends Religious Services: Not on file  . Active Member of Clubs or Organizations: Not on file  . Attends Archivist Meetings: Not on file  . Marital Status: Not on file  Intimate Partner Violence:   . Fear of Current or Ex-Partner: Not on file  . Emotionally Abused: Not on file  . Physically Abused: Not on file  . Sexually Abused: Not on file    Outpatient Medications Prior to Visit  Medication Sig Dispense Refill  . dapagliflozin propanediol (FARXIGA) 5 MG TABS tablet Take 1 tablet (  5 mg total) by mouth daily before breakfast. 30 tablet 5  . glucose blood (CONTOUR NEXT TEST) test strip Test blood sugar twice daily 75 each 5  . losartan (COZAAR) 25 MG tablet TAKE 1 TABLET BY MOUTH ONCE DAILY. 90 tablet 1  . PEG-KCl-NaCl-NaSulf-Na Asc-C (PLENVU) 140 g SOLR Take 140 g by mouth as directed. Manufacturer's coupon Universal coupon code:BIN: P2366821; GROUP: [id number]; PCN: [id number]; ID: [id number]; PAY NO MORE $50 1 each 0  . Blood Glucose Monitoring Suppl (CONTOUR NEXT MONITOR) w/Device KIT 1 application by Does not apply route in the morning and at bedtime. Use to check blood sugar twice daily 1 kit 0  . Lancets 59Y MISC 1 applicator by Does not apply route 2 (two) times daily. 100 each 4   No facility-administered medications prior to visit.    Allergies  Allergen Reactions  .  Lisinopril Cough    Cough, flushing, nausea    ROS Review of Systems Constitutional: Negative for chills and fever.  Respiratory: Negative for shortness of breath.   Cardiovascular: Negative for chest pain and leg swelling.  Gastrointestinal: Negative for abdominal pain, nausea and vomiting. Diarrhea improved    Objective:    Physical Exam  BP 129/83 (BP Location: Left Arm, Patient Position: Sitting, Cuff Size: Normal)   Pulse 86   Temp 98.2 F (36.8 C) (Oral)   Wt 200 lb 9.6 oz (91 kg)   SpO2 100%   BMI 30.50 kg/m  Wt Readings from Last 3 Encounters:  03/05/20 200 lb 9.6 oz (91 kg)  02/21/20 193 lb 8 oz (87.8 kg)  12/26/19 193 lb 14.4 oz (88 kg)   Constitutional: Well-developed and well-nourished. No acute distress.  HENT:  Head: Normocephalic and atraumatic.  Eyes: Conjunctivae are normal, EOM nl Cardiovascular: RRR, nl S1S2, no murmur,  no LEE Respiratory: Effort normal and breath sounds normal. No respiratory distress. No wheezes.  GI: Soft. Bowel sounds are normal. No distension. There is no tenderness.  Neurological: Is alert and oriented x 3  Skin: Not diaphoretic. No erythema.  Psychiatric:  Normal mood and affect. Behavior is normal. Judgment and thought content normal.    Health Maintenance Due  Topic Date Due  . PAP SMEAR-Modifier  Never done    There are no preventive care reminders to display for this patient.  Lab Results  Component Value Date   TSH 0.729 08/29/2019   No results found for: WBC, HGB, HCT, MCV, PLT Lab Results  Component Value Date   NA 134 09/07/2019   K 3.3 (L) 09/07/2019   CO2 24 09/07/2019   GLUCOSE 379 (H) 09/07/2019   BUN 17 09/07/2019   CREATININE 0.61 09/07/2019   CALCIUM 9.1 09/07/2019   No results found for: CHOL No results found for: HDL No results found for: LDLCALC No results found for: TRIG No results found for: CHOLHDL Lab Results  Component Value Date   HGBA1C 6.7 (A) 12/26/2019      Assessment &  Plan:   Problem List Items Addressed This Visit      Cardiovascular and Mediastinum   Essential hypertension      HTN: Patient is currently on losartan 25 mg daily. BP well controlled.Today's blood pressure is normal at 129/83.  BP Readings from Last 3 Encounters:  03/05/20 129/83  02/21/20 118/74  12/26/19 107/68  Recent BMP 6 months ago with nl kidney function and no hyperkalemia.  -Continue Losrtan 25 mg QD         Endocrine  Type 2 diabetes mellitus ([place])    DM: Invokana switch to [place] as a preferred formulary by patient's insurance recently. Patient is currently on Farxiga 5 mg daily. She was on Metformin 1000 mg twice daily but it stopped due to diarrhea.  Last hemoglobin A1c 6.7 on 12/26/2019 (down from 11.4 in 09/2019). She did not bring the personal glucometer. (Also needs a new prescription for that). She mentions that the highest glucose ranges 120 to 170-205 Will continue current regimen and life style modification and repeat A1c in 1 month.  -Continue Farsiga -Continue holding Metformin -She had slight elevation of urine microalbumin/creatinine ratio (31).  On losartan -F/u in clinic in 1 month -Lipid panel next visit -A1c next visit         Patient discussed with   No orders of the defined types were placed in this encounter.   Follow-up: Return in about 4 weeks (around 04/02/2020) for F/u of DM.    Dewayne Hatch, MD

## 2020-03-05 NOTE — Patient Instructions (Signed)
Thank you for allowing [place] to provide your care today. Today we discussed your diabetes and blood pressure. Your blood pressure is normal. Please continue taking your medications and great job of daily walk/exercise for weight loss. Continue taking your diabetes medications and come back to clinic in 1 month.  I am glad that your diarrhea is better. Please follow up with Gi doctor for colonoscopy. Today we made no changes to your medications.    Please follow-up in clinic in 1 month to check HbA1c and cholestrol level.  Should you have any questions or concerns please call the internal medicine clinic at [phone].

## 2020-03-06 ENCOUNTER — Encounter: Payer: Self-pay | Admitting: Internal Medicine

## 2020-03-06 NOTE — Assessment & Plan Note (Addendum)
DM: Invokana switch to [place] as a preferred formulary by patient's insurance recently. Patient is currently on Farxiga 5 mg daily. She was on Metformin 1000 mg twice daily but it stopped due to diarrhea.  Last hemoglobin A1c 6.7 on 12/26/2019 (down from 11.4 in 09/2019). She did not bring the personal glucometer. (Also needs a new prescription for that). She mentions that the highest glucose ranges 120 to 170-205 Will continue current regimen and life style modification and repeat A1c in 1 month.  -Continue Farsiga -Continue holding Metformin -She had slight elevation of urine microalbumin/creatinine ratio (31).  On losartan -F/u in clinic in 1 month -Lipid panel next visit -A1c next visit

## 2020-03-06 NOTE — Assessment & Plan Note (Signed)
   HTN: Patient is currently on losartan 25 mg daily. BP well controlled.Today's blood pressure is normal at 129/83.  BP Readings from Last 3 Encounters:  03/05/20 129/83  02/21/20 118/74  12/26/19 107/68  Recent BMP 6 months ago with nl kidney function and no hyperkalemia.  -Continue Losrtan 25 mg QD

## 2020-03-07 MED FILL — CONTOUR NEXT METER: W/DEVICE | 30 days supply | Qty: 1 | Fill #0

## 2020-03-12 NOTE — Progress Notes (Signed)
Internal Medicine Clinic Attending  Case discussed with Dr. Masoudi  At the time of the visit.  We reviewed the resident's history and exam and pertinent patient test results.  I agree with the assessment, diagnosis, and plan of care documented in the resident's note.  

## 2020-03-20 MED FILL — FARXIGA 5 MG TABLET: 5 | 30 days supply | Qty: 30 | Fill #1

## 2020-03-29 ENCOUNTER — Other Ambulatory Visit: Payer: Self-pay

## 2020-03-29 ENCOUNTER — Encounter: Payer: Self-pay | Admitting: Gastroenterology

## 2020-03-29 ENCOUNTER — Ambulatory Visit (AMBULATORY_SURGERY_CENTER): Payer: BC Managed Care – PPO | Admitting: Gastroenterology

## 2020-03-29 VITALS — BP 117/84 | HR 76 | Temp 97.1°F | Resp 11 | Ht 68.0 in | Wt 200.0 lb

## 2020-03-29 DIAGNOSIS — Z1211 Encounter for screening for malignant neoplasm of colon: Secondary | ICD-10-CM | POA: Diagnosis not present

## 2020-03-29 DIAGNOSIS — K529 Noninfective gastroenteritis and colitis, unspecified: Secondary | ICD-10-CM

## 2020-03-29 DIAGNOSIS — K6289 Other specified diseases of anus and rectum: Secondary | ICD-10-CM | POA: Diagnosis not present

## 2020-03-29 DIAGNOSIS — K629 Disease of anus and rectum, unspecified: Secondary | ICD-10-CM

## 2020-03-29 MED ORDER — SODIUM CHLORIDE 0.9 % IV SOLN: 500.0000 mL | Freq: Once | INTRAVENOUS | Status: DC

## 2020-03-29 NOTE — Progress Notes (Signed)
Called to room to assist during endoscopic procedure.  Patient ID and intended procedure confirmed with present staff. Received instructions for my participation in the procedure from the performing physician.  

## 2020-03-29 NOTE — Op Note (Signed)
[facility] Patient Name: Alyssa Buck Procedure Date: 03/29/2020 4:15 PM MRN: [id number] Endoscopist: Sherilyn Cooter L. Myrtie Neither , MD Age: 49 Referring MD:  Date of Birth: 18-Oct-1970 Gender: Female Account #: [id number] Procedure:                Colonoscopy Indications:              Screening for colorectal malignant neoplasm, This                            is the patient's first colonoscopy, Incidental                            diarrhea noted Medicines:                Monitored Anesthesia Care Procedure:                Pre-Anesthesia Assessment:                           - Prior to the procedure, a History and Physical                            was performed, and patient medications and                            allergies were reviewed. The patient's tolerance of                            previous anesthesia was also reviewed. The risks                            and benefits of the procedure and the sedation                            options and risks were discussed with the patient.                            All questions were answered, and informed consent                            was obtained. Prior Anticoagulants: The patient has                            taken no previous anticoagulant or antiplatelet                            agents. ASA Grade Assessment: II - A patient with                            mild systemic disease. After reviewing the risks                            and benefits, the patient was deemed in  satisfactory condition to undergo the procedure.                           After obtaining informed consent, the colonoscope                            was passed under direct vision. Throughout the                            procedure, the patient's blood pressure, pulse, and                            oxygen saturations were monitored continuously. The                            Olympus CF-HQ190L ([id number]) Colonoscope was                             introduced through the anus and advanced to the the                            terminal ileum, with identification of the                            appendiceal orifice and IC valve. The colonoscopy                            was performed without difficulty. The patient                            tolerated the procedure well. The quality of the                            bowel preparation was good. The terminal ileum,                            ileocecal valve, appendiceal orifice, and rectum                            were photographed. Scope In: 4:22:42 PM Scope Out: 4:48:59 PM Scope Withdrawal Time: 0 hours 22 minutes 57 seconds  Total Procedure Duration: 0 hours 26 minutes 17 seconds  Findings:                 The perianal and digital rectal examinations were                            normal.                           The terminal ileum appeared normal.                           Normal mucosa was found in the entire colon.  Biopsies for histology were taken with a cold                            forceps from the right colon and left colon for                            evaluation of microscopic colitis.                           A small polypoid lesion was found at the anus (anal                            verge). The lesion was semi-pedunculated. No                            bleeding was present. Biopsies were taken with a                            cold forceps for histology (most of lesion                            removed). Mild oozing after biopsy stopped within                            several minutes.                           The exam was otherwise without abnormality on                            direct and retroflexion views. Complications:            No immediate complications. Estimated Blood Loss:     Estimated blood loss was minimal. Impression:               - The examined portion of the ileum was normal.                            - Normal mucosa in the entire examined colon.                            Biopsied.                           - Likely benign polypoid lesion at the anus.                            Biopsied. (likely anal papilla or AIN)                           - The examination was otherwise normal on direct                            and retroflexion views. Recommendation:           - Patient has a  contact number available for                            emergencies. The signs and symptoms of potential                            delayed complications were discussed with the                            patient. Return to normal activities tomorrow.                            Written discharge instructions were provided to the                            patient.                           - Resume previous diet.                           - Continue present medications.                           - Repeat colonoscopy in 10 years for screening                            purposes.                           - Await pathology results. Besse Miron L. Myrtie Neither, MD 03/29/2020 5:00:24 PM This report has been signed electronically.

## 2020-03-29 NOTE — Progress Notes (Signed)
Report given to PACU, vss 

## 2020-03-29 NOTE — Patient Instructions (Signed)
Impression/Recommendations:  Resume previous diet. Continue present medications. Repeat colonoscopy in 10 years for screening purposes.  Await pathology results.  YOU HAD AN ENDOSCOPIC PROCEDURE TODAY AT THE [facility]:   Refer to the procedure report that was given to you for any specific questions about what was found during the examination.  If the procedure report does not answer your questions, please call your gastroenterologist to clarify.  If you requested that your care partner not be given the details of your procedure findings, then the procedure report has been included in a sealed envelope for you to review at your convenience later.  YOU SHOULD EXPECT: Some feelings of bloating in the abdomen. Passage of more gas than usual.  Walking can help get rid of the air that was put into your GI tract during the procedure and reduce the bloating. If you had a lower endoscopy (such as a colonoscopy or flexible sigmoidoscopy) you may notice spotting of blood in your stool or on the toilet paper. If you underwent a bowel prep for your procedure, you may not have a normal bowel movement for a few days.  Please Note:  You might notice some irritation and congestion in your nose or some drainage.  This is from the oxygen used during your procedure.  There is no need for concern and it should clear up in a day or so.  SYMPTOMS TO REPORT IMMEDIATELY:   Following lower endoscopy (colonoscopy or flexible sigmoidoscopy):  Excessive amounts of blood in the stool  Significant tenderness or worsening of abdominal pains  Swelling of the abdomen that is new, acute  Fever of 100F or higher For urgent or emergent issues, a gastroenterologist can be reached at any hour by calling [phone]. Do not use MyChart messaging for urgent concerns.    DIET:  We do recommend a small meal at first, but then you may proceed to your regular diet.  Drink plenty of fluids but you should avoid  alcoholic beverages for 24 hours.  ACTIVITY:  You should plan to take it easy for the rest of today and you should NOT DRIVE or use heavy machinery until tomorrow (because of the sedation medicines used during the test).    FOLLOW UP: Our staff will call the number listed on your records 48-72 hours following your procedure to check on you and address any questions or concerns that you may have regarding the information given to you following your procedure. If we do not reach you, we will leave a message.  We will attempt to reach you two times.  During this call, we will ask if you have developed any symptoms of COVID 19. If you develop any symptoms (ie: fever, flu-like symptoms, shortness of breath, cough etc.) before then, please call [phone].  If you test positive for Covid 19 in the 2 weeks post procedure, please call and report this information to [place].    If any biopsies were taken you will be contacted by phone or by letter within the next 1-3 weeks.  Please call us at [phone] if you have not heard about the biopsies in 3 weeks.    SIGNATURES/CONFIDENTIALITY: You and/or your care partner have signed paperwork which will be entered into your electronic medical record.  These signatures attest to the fact that that the information above on your After Visit Summary has been reviewed and is understood.  Full responsibility of the confidentiality of this discharge information lies with you and/or your  care-partner.

## 2020-04-02 ENCOUNTER — Telehealth: Payer: Self-pay | Admitting: *Deleted

## 2020-04-02 ENCOUNTER — Ambulatory Visit: Payer: BC Managed Care – PPO | Admitting: Student

## 2020-04-02 ENCOUNTER — Encounter: Payer: Self-pay | Admitting: Student

## 2020-04-02 ENCOUNTER — Other Ambulatory Visit: Payer: Self-pay

## 2020-04-02 VITALS — BP 126/87 | HR 82 | Temp 98.1°F | Ht 68.0 in | Wt 204.5 lb

## 2020-04-02 DIAGNOSIS — Z13220 Encounter for screening for lipoid disorders: Secondary | ICD-10-CM | POA: Diagnosis not present

## 2020-04-02 DIAGNOSIS — I10 Essential (primary) hypertension: Secondary | ICD-10-CM | POA: Diagnosis not present

## 2020-04-02 DIAGNOSIS — E1169 Type 2 diabetes mellitus with other specified complication: Secondary | ICD-10-CM | POA: Diagnosis not present

## 2020-04-02 DIAGNOSIS — R197 Diarrhea, unspecified: Secondary | ICD-10-CM

## 2020-04-02 DIAGNOSIS — Z0000 Encounter for general adult medical examination without abnormal findings: Secondary | ICD-10-CM | POA: Insufficient documentation

## 2020-04-02 LAB — POCT GLYCOSYLATED HEMOGLOBIN (HGB A1C): Hemoglobin A1C: 6.2 % — AB (ref 4.0–5.6)

## 2020-04-02 LAB — GLUCOSE, CAPILLARY: Glucose-Capillary: 110 mg/dL — ABNORMAL HIGH (ref 70–99)

## 2020-04-02 NOTE — Assessment & Plan Note (Addendum)
Hemoglobin A1c 6.2% today, down from 6.7% on 12/26/19. Patient reports adherence to Farxiga 5 mg daily. Denies polyuria or polydipsia. States she has been working on [employer] and regular exercise. Notes it has been harder to motivate with the colder weather.  Discussed prior intolerance to Metformin. She reports having had significant diarrhea, even at night, which improved significantly with cessation of metformin.   Plan: - Continue Farxiga 5 mg daily - Lipid panel today - Next A1c in 3 months (~07/02/19) - Discussed importance of healthy eating, including strategies such as incorporating more vegetables, fruits, and lean proteins, while cutting down on sugary beverages, sweet treats, and processed foods - Discussed goal of 150 to 300 minutes (2  to 5 hours) a week of moderate-intensity activity such as brisk walking

## 2020-04-02 NOTE — Progress Notes (Signed)
   CC: diabetes follow-up  HPI:  Alyssa Buck is a 49 y.o. woman with history as below who presents to clinic for 77-month follow-up from her last clinic on 03/05/20.   To see the details of this patient's management of their acute and chronic problems, please refer to the Assessment & Plan under the Encounters tab.    Past Medical History:  Diagnosis Date  . Diabetes (HCC)   . Hypertension    Review of Systems:    Review of Systems  Constitutional: Negative for chills, fever and weight loss.  HENT: Negative for congestion.   Eyes: Negative for blurred vision.  Respiratory: Negative for cough.   Cardiovascular: Negative for chest pain.  Gastrointestinal: Negative for abdominal pain.  Genitourinary: Negative for frequency.  Musculoskeletal: Negative for myalgias.  Neurological: Negative for dizziness.    Physical Exam:  Vitals:   04/02/20 1358 04/02/20 1422  BP: 137/88 126/87  Pulse: 82 82  Temp: 98.1 F (36.7 C)   TempSrc: Oral   SpO2: 100%   Weight: 204 lb 8 oz (92.8 kg)   Height: 5\' 8"  (1.727 m)    Constitutional: well-appearing woman sitting in chair, in no acute distress HENT: normocephalic atraumatic, mucous membranes moist Eyes: conjunctiva non-erythematous Neck: supple Cardiovascular: regular rate and rhythm, no m/r/g Pulmonary/Chest: normal work of breathing on room air, lungs clear to auscultation bilaterally Abdominal: soft, non-distended MSK: normal bulk and tone Neurological: alert & oriented x 3, normal gait Skin: warm and dry   Assessment & Plan:   See Encounters Tab for problem based charting.  Patient seen with Dr. 

## 2020-04-02 NOTE — Telephone Encounter (Signed)
°  Follow up Call-  Call back number 03/29/2020  Post procedure Call Back phone  # [phone]  Permission to leave phone message Yes  Some recent data might be hidden     Patient questions:  Do you have a fever, pain , or abdominal swelling? No. Pain Score  0 *  Have you tolerated food without any problems? Yes.    Have you been able to return to your normal activities? Yes.    Do you have any questions about your discharge instructions: Diet   No. Medications  No. Follow up visit  No.  Do you have questions or concerns about your Care? No.  Actions: * If pain score is 4 or above: No action needed, pain <4  1. Have you developed a fever since your procedure? NO  2.   Have you had an respiratory symptoms (SOB or cough) since your procedure? NO  3.   Have you tested positive for COVID 19 since your procedure NO  4.   Have you had any family members/close contacts diagnosed with the COVID 19 since your procedure?  NO   If yes to any of these questions please route to Laverna Peace, RN and Karlton Lemon, RN

## 2020-04-02 NOTE — Assessment & Plan Note (Signed)
BP today 126/87. Patient reports adherence to losartan 25 mg daily. Denies headache, vision changes, dizziness.  - Continue Losartan 25 mg daily

## 2020-04-02 NOTE — Patient Instructions (Addendum)
Ms. Wiens,   Thank you for your visit to the [facility] today. It was a pleasure meeting you. Today we discussed the following:  1) Diabetes: great job managing your diabetes! Your A1c was 6.2%.  - Continue farxiga 5 mg daily - Continue your healthy diet and regular exercise  2) Hypertension: your blood pressure was good today - Continue losartan 25 mg daily  3) We are getting blood work today to see your cholesterol levels. I will call you with the result.  4) COVID-19 Booster: We highly recommend getting your booster shot against COVID-19.  - You can get your booster at any major pharmacy. If you are interested in setting up an appointment to get vaccinated through [facility], you can call the [facility] COVID-19 hotline at [phone].   We would like to see you back in 3 months for diabetes and hypertension follow-up. Please bring all of your medications with you.   If you have any questions or concerns, please call our clinic at [phone] between 9am-5pm. Outside of these hours, call [phone] and ask for the internal medicine resident on call. If you feel you are having a medical emergency please call 911.

## 2020-04-02 NOTE — Assessment & Plan Note (Addendum)
Lipid panel today  ADDENDUM:   Ref Range & Units 1 d ago  Cholesterol, Total 100 - 199 mg/dL 761   Triglycerides 0 - 149 mg/dL 94   HDL >60 mg/dL 49   VLDL Cholesterol Cal 5 - 40 mg/dL 17   LDL Chol Calc (NIH) 0 - 99 mg/dL 93   Chol/HDL Ratio 0.0 - 4.4 ratio 3.2    Current 10-year ASCVD Risk is 5.8% based on the following inputs:  Current age: 49 Sex: F Race: AA Systolic BP: 126 Diastolic BP: 87 Total Cholesterol: 159 HDL Cholesterol: 49 LDL Cholesterol: 93 History of Diabetes?: yes Smoker?: no On HTN treatment? yes On a Statin?: no On aspirin therapy?: no  Source: https://tools.acc.org/ascvd-risk-estimator-plus/#!/calculate/estimate/   A/P: with an ASCVD risk of <7.5%, will hold off on initiating statin therapy at this time.  - Repeat lipid panel in 2-3 years

## 2020-04-02 NOTE — Assessment & Plan Note (Addendum)
Patient reports she is no longer having nocturnal diarrhea. She states it resolved after cessation of metformin. Seen by GI (Dr. Myrtie Neither) on 02/21/20 who noted that patient has history of chronic diarrhea with bloating and gas that improved after stopping Metformin. Differential included intermittent delayed emptying from diabetes. Her stool negative for occult blood and inflammatory markers, celiac labs negative. She Went for screening colonoscopy per GI on 03/29/20 which showed Likely benign polypoid lesion at the anus (likely anal papilla or AIN) which was biopsied. Otherwise exam was normal.   Plan - awaiting pathology - repeat colonoscopy in 10 years for colon cancer screening

## 2020-04-03 LAB — LIPID PANEL
Chol/HDL Ratio: 3.2 ratio (ref 0.0–4.4)
Cholesterol, Total: 159 mg/dL (ref 100–199)
HDL: 49 mg/dL (ref >39)
LDL Chol Calc (NIH): 93 mg/dL (ref 0–99)
Triglycerides: 94 mg/dL (ref 0–149)
VLDL Cholesterol Cal: 17 mg/dL (ref 5–40)

## 2020-04-03 NOTE — Progress Notes (Signed)
Internal Medicine Clinic Attending  I saw and evaluated the patient.  I personally confirmed the key portions of the history and exam documented by Dr. Watson and I reviewed pertinent patient test results.  The assessment, diagnosis, and plan were formulated together and I agree with the documentation in the resident's note.  

## 2020-04-15 ENCOUNTER — Encounter: Payer: Self-pay | Admitting: Gastroenterology

## 2020-04-23 MED FILL — FARXIGA 5 MG TABLET: 5 | 30 days supply | Qty: 30 | Fill #2

## 2020-05-21 ENCOUNTER — Other Ambulatory Visit: Payer: Self-pay | Admitting: Student

## 2020-05-21 MED FILL — CONTOUR NEXT STRIPS: 25 days supply | Qty: 50 | Fill #3

## 2020-05-21 MED FILL — LOSARTAN POTASSIUM 25 MG TA: 25 | 30 days supply | Qty: 30 | Fill #0

## 2020-05-21 MED FILL — FARXIGA 5 MG TABLET: 5 | 30 days supply | Qty: 30 | Fill #3

## 2020-05-29 DIAGNOSIS — H35033 Hypertensive retinopathy, bilateral: Secondary | ICD-10-CM | POA: Diagnosis not present

## 2020-05-29 DIAGNOSIS — E113413 Type 2 diabetes mellitus with severe nonproliferative diabetic retinopathy with macular edema, bilateral: Secondary | ICD-10-CM | POA: Diagnosis not present

## 2020-05-29 DIAGNOSIS — H43822 Vitreomacular adhesion, left eye: Secondary | ICD-10-CM | POA: Diagnosis not present

## 2020-05-29 DIAGNOSIS — H2513 Age-related nuclear cataract, bilateral: Secondary | ICD-10-CM | POA: Diagnosis not present

## 2020-06-18 MED FILL — LOSARTAN POTASSIUM 25 MG TA: 25 | 30 days supply | Qty: 30 | Fill #1

## 2020-07-17 ENCOUNTER — Other Ambulatory Visit (HOSPITAL_COMMUNITY): Payer: Self-pay

## 2020-07-17 MED FILL — Losartan Potassium Tab 25 MG: ORAL | 30 days supply | Qty: 30 | Fill #0 | Status: AC

## 2020-07-17 MED FILL — Dapagliflozin Propanediol Tab 5 MG (Base Equivalent): ORAL | 30 days supply | Qty: 30 | Fill #0 | Status: AC

## 2020-07-18 ENCOUNTER — Other Ambulatory Visit (HOSPITAL_COMMUNITY): Payer: Self-pay

## 2020-08-14 ENCOUNTER — Other Ambulatory Visit: Payer: Self-pay | Admitting: Student

## 2020-08-14 ENCOUNTER — Other Ambulatory Visit (HOSPITAL_COMMUNITY): Payer: Self-pay

## 2020-08-14 MED FILL — Losartan Potassium Tab 25 MG: ORAL | 30 days supply | Qty: 30 | Fill #1 | Status: AC

## 2020-08-14 MED FILL — Glucose Blood Test Strip: 37 days supply | Qty: 75 | Fill #0 | Status: AC

## 2020-08-15 ENCOUNTER — Other Ambulatory Visit (HOSPITAL_COMMUNITY): Payer: Self-pay

## 2020-08-15 MED ORDER — DAPAGLIFLOZIN PROPANEDIOL 5 MG PO TABS
5.0000 mg | ORAL_TABLET | Freq: Every day | ORAL | 3 refills | Status: DC
  Filled 2020-08-15: qty 90, 90d supply, fill #0
  Filled 2020-11-19: qty 90, 90d supply, fill #1
  Filled 2021-02-22: qty 90, 90d supply, fill #2
  Filled 2021-06-03: qty 90, 90d supply, fill #3

## 2020-08-17 ENCOUNTER — Other Ambulatory Visit (HOSPITAL_COMMUNITY): Payer: Self-pay

## 2020-09-10 ENCOUNTER — Encounter: Payer: Self-pay | Admitting: *Deleted

## 2020-09-17 ENCOUNTER — Other Ambulatory Visit (HOSPITAL_COMMUNITY): Payer: Self-pay

## 2020-09-17 MED FILL — Losartan Potassium Tab 25 MG: ORAL | 30 days supply | Qty: 30 | Fill #2 | Status: AC

## 2020-09-25 DIAGNOSIS — H35033 Hypertensive retinopathy, bilateral: Secondary | ICD-10-CM | POA: Diagnosis not present

## 2020-09-25 DIAGNOSIS — H43822 Vitreomacular adhesion, left eye: Secondary | ICD-10-CM | POA: Diagnosis not present

## 2020-09-25 DIAGNOSIS — H2513 Age-related nuclear cataract, bilateral: Secondary | ICD-10-CM | POA: Diagnosis not present

## 2020-09-25 DIAGNOSIS — E113413 Type 2 diabetes mellitus with severe nonproliferative diabetic retinopathy with macular edema, bilateral: Secondary | ICD-10-CM | POA: Diagnosis not present

## 2020-10-12 ENCOUNTER — Other Ambulatory Visit (HOSPITAL_COMMUNITY): Payer: Self-pay

## 2020-10-12 MED FILL — Losartan Potassium Tab 25 MG: ORAL | 30 days supply | Qty: 30 | Fill #3 | Status: AC

## 2020-10-16 ENCOUNTER — Encounter: Payer: Self-pay | Admitting: *Deleted

## 2020-11-19 ENCOUNTER — Other Ambulatory Visit (HOSPITAL_COMMUNITY): Payer: Self-pay

## 2020-11-19 ENCOUNTER — Other Ambulatory Visit: Payer: Self-pay

## 2020-11-20 ENCOUNTER — Other Ambulatory Visit (HOSPITAL_COMMUNITY): Payer: Self-pay

## 2020-11-20 ENCOUNTER — Other Ambulatory Visit: Payer: Self-pay | Admitting: Student

## 2020-11-21 ENCOUNTER — Other Ambulatory Visit (HOSPITAL_COMMUNITY): Payer: Self-pay

## 2020-11-21 ENCOUNTER — Other Ambulatory Visit: Payer: Self-pay | Admitting: Student

## 2020-11-21 MED ORDER — LOSARTAN POTASSIUM 25 MG PO TABS
25.0000 mg | ORAL_TABLET | Freq: Every day | ORAL | 1 refills | Status: DC
  Filled 2020-11-21: qty 90, 90d supply, fill #0
  Filled 2021-02-22: qty 90, 90d supply, fill #1

## 2021-01-01 DIAGNOSIS — E113513 Type 2 diabetes mellitus with proliferative diabetic retinopathy with macular edema, bilateral: Secondary | ICD-10-CM | POA: Diagnosis not present

## 2021-01-01 DIAGNOSIS — H2513 Age-related nuclear cataract, bilateral: Secondary | ICD-10-CM | POA: Diagnosis not present

## 2021-01-01 DIAGNOSIS — H43822 Vitreomacular adhesion, left eye: Secondary | ICD-10-CM | POA: Diagnosis not present

## 2021-01-01 DIAGNOSIS — H35033 Hypertensive retinopathy, bilateral: Secondary | ICD-10-CM | POA: Diagnosis not present

## 2021-01-08 DIAGNOSIS — E113511 Type 2 diabetes mellitus with proliferative diabetic retinopathy with macular edema, right eye: Secondary | ICD-10-CM | POA: Diagnosis not present

## 2021-01-15 DIAGNOSIS — E113512 Type 2 diabetes mellitus with proliferative diabetic retinopathy with macular edema, left eye: Secondary | ICD-10-CM | POA: Diagnosis not present

## 2021-01-22 DIAGNOSIS — E113513 Type 2 diabetes mellitus with proliferative diabetic retinopathy with macular edema, bilateral: Secondary | ICD-10-CM | POA: Diagnosis not present

## 2021-01-29 DIAGNOSIS — E113512 Type 2 diabetes mellitus with proliferative diabetic retinopathy with macular edema, left eye: Secondary | ICD-10-CM | POA: Diagnosis not present

## 2021-01-30 ENCOUNTER — Other Ambulatory Visit: Payer: Self-pay

## 2021-01-31 ENCOUNTER — Other Ambulatory Visit (HOSPITAL_COMMUNITY): Payer: Self-pay

## 2021-01-31 ENCOUNTER — Other Ambulatory Visit: Payer: Self-pay

## 2021-01-31 DIAGNOSIS — E1169 Type 2 diabetes mellitus with other specified complication: Secondary | ICD-10-CM

## 2021-01-31 MED ORDER — MICROLET LANCETS MISC
7 refills | Status: DC
  Filled 2021-01-31 – 2021-02-22 (×3): qty 100, 50d supply, fill #0
  Filled 2021-10-30: qty 100, 50d supply, fill #1

## 2021-02-08 ENCOUNTER — Other Ambulatory Visit (HOSPITAL_COMMUNITY): Payer: Self-pay

## 2021-02-14 ENCOUNTER — Other Ambulatory Visit (HOSPITAL_COMMUNITY): Payer: Self-pay

## 2021-02-14 ENCOUNTER — Other Ambulatory Visit: Payer: Self-pay | Admitting: Internal Medicine

## 2021-02-14 MED ORDER — CONTOUR NEXT TEST VI STRP
ORAL_STRIP | 5 refills | Status: DC
  Filled 2021-02-14 – 2021-02-22 (×2): qty 75, 37d supply, fill #0
  Filled 2021-10-30: qty 75, 37d supply, fill #1

## 2021-02-22 ENCOUNTER — Other Ambulatory Visit (HOSPITAL_COMMUNITY): Payer: Self-pay

## 2021-02-27 ENCOUNTER — Encounter: Payer: BC Managed Care – PPO | Admitting: Internal Medicine

## 2021-03-22 DIAGNOSIS — H31093 Other chorioretinal scars, bilateral: Secondary | ICD-10-CM | POA: Diagnosis not present

## 2021-03-22 DIAGNOSIS — H35033 Hypertensive retinopathy, bilateral: Secondary | ICD-10-CM | POA: Diagnosis not present

## 2021-03-22 DIAGNOSIS — E113513 Type 2 diabetes mellitus with proliferative diabetic retinopathy with macular edema, bilateral: Secondary | ICD-10-CM | POA: Diagnosis not present

## 2021-03-22 DIAGNOSIS — H43822 Vitreomacular adhesion, left eye: Secondary | ICD-10-CM | POA: Diagnosis not present

## 2021-05-17 DIAGNOSIS — H31093 Other chorioretinal scars, bilateral: Secondary | ICD-10-CM | POA: Diagnosis not present

## 2021-05-17 DIAGNOSIS — H43822 Vitreomacular adhesion, left eye: Secondary | ICD-10-CM | POA: Diagnosis not present

## 2021-05-17 DIAGNOSIS — E113513 Type 2 diabetes mellitus with proliferative diabetic retinopathy with macular edema, bilateral: Secondary | ICD-10-CM | POA: Diagnosis not present

## 2021-05-17 DIAGNOSIS — H35033 Hypertensive retinopathy, bilateral: Secondary | ICD-10-CM | POA: Diagnosis not present

## 2021-06-03 ENCOUNTER — Other Ambulatory Visit (HOSPITAL_COMMUNITY): Payer: Self-pay

## 2021-06-03 ENCOUNTER — Other Ambulatory Visit: Payer: Self-pay | Admitting: Student

## 2021-06-04 ENCOUNTER — Other Ambulatory Visit (HOSPITAL_COMMUNITY): Payer: Self-pay

## 2021-06-05 ENCOUNTER — Other Ambulatory Visit (HOSPITAL_COMMUNITY): Payer: Self-pay

## 2021-06-05 ENCOUNTER — Other Ambulatory Visit: Payer: Self-pay | Admitting: Student

## 2021-06-05 NOTE — Telephone Encounter (Signed)
Next appt scheduled 06/12/21 with PCP. °

## 2021-06-07 ENCOUNTER — Other Ambulatory Visit (HOSPITAL_COMMUNITY): Payer: Self-pay

## 2021-06-07 ENCOUNTER — Other Ambulatory Visit: Payer: Self-pay | Admitting: Student

## 2021-06-07 MED ORDER — LOSARTAN POTASSIUM 25 MG PO TABS
25.0000 mg | ORAL_TABLET | Freq: Every day | ORAL | 1 refills | Status: DC
  Filled 2021-06-07: qty 90, 90d supply, fill #0

## 2021-06-12 ENCOUNTER — Ambulatory Visit: Payer: BC Managed Care – PPO | Admitting: Internal Medicine

## 2021-06-12 ENCOUNTER — Other Ambulatory Visit: Payer: Self-pay

## 2021-06-12 VITALS — BP 142/89 | HR 88 | Temp 98.3°F | Ht 68.0 in | Wt 237.0 lb

## 2021-06-12 DIAGNOSIS — I10 Essential (primary) hypertension: Secondary | ICD-10-CM | POA: Diagnosis not present

## 2021-06-12 DIAGNOSIS — E119 Type 2 diabetes mellitus without complications: Secondary | ICD-10-CM | POA: Diagnosis not present

## 2021-06-12 DIAGNOSIS — Z0000 Encounter for general adult medical examination without abnormal findings: Secondary | ICD-10-CM

## 2021-06-12 DIAGNOSIS — Z0100 Encounter for examination of eyes and vision without abnormal findings: Secondary | ICD-10-CM

## 2021-06-12 DIAGNOSIS — E878 Other disorders of electrolyte and fluid balance, not elsewhere classified: Secondary | ICD-10-CM

## 2021-06-12 LAB — POCT GLYCOSYLATED HEMOGLOBIN (HGB A1C): Hemoglobin A1C: 8 % — AB (ref 4.0–5.6)

## 2021-06-12 LAB — GLUCOSE, CAPILLARY: Glucose-Capillary: 161 mg/dL — ABNORMAL HIGH (ref 70–99)

## 2021-06-12 MED ORDER — LOSARTAN POTASSIUM 25 MG PO TABS: 50.0000 mg | ORAL_TABLET | Freq: Every day | ORAL | 1 refills | Status: DC

## 2021-06-12 MED ORDER — DAPAGLIFLOZIN PROPANEDIOL 5 MG PO TABS: 10.0000 mg | ORAL_TABLET | Freq: Every day | ORAL | 3 refills | Status: DC

## 2021-06-12 NOTE — Addendum Note (Signed)
Addended byCarmel Sacramento on: 06/12/2021 03:18 PM ? ? Modules accepted: Orders ? ?

## 2021-06-12 NOTE — Assessment & Plan Note (Addendum)
BP elevated 142/89 today. Reports good poor medication compliance. Tolerating medication without adverse effects. Denies headaches, vision changes, lightheadedness, chest pain, SHOB, leg swelling or changes in speech. Exam benign and vitals otherwise wnl. Reports decreased physical activity and poor diet recently. Counseled on the importance of daily exercise, low salt diet, and weight loss.  ? ?Increase to Losartan 50 mg daily  ?F/u on BMP from today  ?Continue lifestyle changes ?F/u in 4 weeks for repeat BMP ? ?Addendum: ?Cr at baseline of 0.6. Will repeat BMP in 4 weeks, given increase in Losartan dose.  ?

## 2021-06-12 NOTE — Patient Instructions (Signed)
Double you Farxiga from 5 to 10 mg daily ?Double your Losartan from 25 to 50 mg daily  ?Think about starting Ozempic, and we can talk about it in 4 weeks  ?Come back in 4 weeks for repeat lab work after increasing Losartan dose  ? ?Get your COVID booster shot at your local pharmacy ?Think about the Tetanus shot for your next visit  ? ?Continue to exercise and eat healthy! ?

## 2021-06-12 NOTE — Assessment & Plan Note (Addendum)
A1c 8 today, was 6.2 1 yr ago. CBG 161 today. Diabetes not at goal on current regimen, likely d/t diet, lack of exercise and poor follow up. Reports good medication compliance. Tolerating medication without adverse effects. Counseled on the benefits of daily exercise, limiting processed foods and high sugar foods, and weight loss. Denies polydipsia, polyuria, weight loss, lethargy, blurry vision, and changes in sensation. No wounds or ulcer of bilateral feet. Benign physical exam and vitals wnl. LDL 93 one year ago. Given BMI 36 and increase A1c, discussed Ozempic or Mounjaro as a possible option for diabetes and added benefit of weight loss; pt is interested but she would like to think more about it and decide during f/u visit in 4 weeks.  ? ?Increase Farxiga from 5 to 10 mg daily  ?Continue lifestyle modifications ?Foot exam done  ?Eye exam referral placed ?Ozempic information provided; f/u on this during next visit  ? ? ?

## 2021-06-12 NOTE — Progress Notes (Signed)
? ?  CC: routine follow up  ? ?HPI: ? ?[name] is a 51 y.o. female with a PMHx stated below and presents today for stated above. Please see the Encounters tab for problem-based Assessment & Plan for additional details.  ? ?Past Medical History:  ?Diagnosis Date  ? Diabetes (HCC)   ? Hypertension   ? ? ?Current Outpatient Medications on File Prior to Visit  ?Medication Sig Dispense Refill  ? dapagliflozin propanediol (FARXIGA) 5 MG TABS tablet Take 1 tablet (5 mg total) by mouth daily before breakfast. 90 tablet 3  ? glucose blood (CONTOUR NEXT TEST) test strip USE TO CHECK BLOOD GLUCOSE TWICE DAILY. 75 strip 5  ? losartan (COZAAR) 25 MG tablet Take 1 tablet (25 mg total) by mouth daily. 90 tablet 1  ? Microlet Lancets MISC USE TO TEST BLOOD SUGAR TWICE DAILY. 100 each 4  ? Microlet Lancets MISC Check blood sugar twice a day. 100 each 7  ? ?No current facility-administered medications on file prior to visit.  ? ? ?Family History  ?Problem Relation Age of Onset  ? Hypertension Mother   ? Hypertension Father   ? Diabetes Mellitus II Father   ? Hypertension Maternal Grandmother   ? Heart failure Maternal Grandmother   ? Lymphoma Maternal Grandmother   ?     big lymph nodes   ? Hypertension Paternal Grandmother   ? Breast cancer Maternal Aunt   ? Thyroid disease Paternal Aunt   ? Colon cancer Neg Hx   ? Stomach cancer Neg Hx   ? Esophageal cancer Neg Hx   ? Pancreatic cancer Neg Hx   ? ? ?Social History  ? ?Socioeconomic History  ? Marital status: Married  ?  Spouse name: Not on file  ? Number of children: Not on file  ? Years of education: Not on file  ? Highest education level: Not on file  ?Occupational History  ? Occupation: early childhood educator  ?Tobacco Use  ? Smoking status: Never  ? Smokeless tobacco: Never  ?Vaping Use  ? Vaping Use: Never used  ?Substance and Sexual Activity  ? Alcohol use: Not Currently  ? Drug use: Never  ? Sexual activity: Not on file  ?Other Topics Concern  ? Not on file   ?Social History Narrative  ? Not on file  ? ?Social Determinants of Health  ? ?Financial Resource Strain: Not on file  ?Food Insecurity: Not on file  ?Transportation Needs: Not on file  ?Physical Activity: Not on file  ?Stress: Not on file  ?Social Connections: Not on file  ?Intimate Partner Violence: Not on file  ? ? ?Review of Systems: ?ROS negative except for what is noted on the assessment and plan. ? ?There were no vitals filed for this visit. ? ?Physical Exam: ?Constitutional: alert, well-appearing, in NAD ?Eyes: conjunctiva non-erythematous, EOMI ?Cardiovascular: RRR, no m/r/g, non-edematous bilateral LE ?Pulmonary/Chest: normal work of breathing on RA, LCTAB ?Abdominal: soft, non-tender to palpation, non-distended ?MSK: normal bulk and tone  ?Neurological: A&O x 3, follows commands  ?Skin: warm and dry  ?Psych: normal behavior, normal affect  ? ? ?Assessment & Plan:  ? ?See Encounters Tab for problem based charting. ? ?Patient discussed with Dr. Heide Spark ? ?[facility] [place], MD  ?Internal Medicine Resident, PGY-1 ?[facility]  ?

## 2021-06-12 NOTE — Assessment & Plan Note (Addendum)
Offered flu and tetanus, she declined ?Encouraged to get COVID booster at pharmacy  ?Foot exam done  ?Referral for diabetic eye exam placed  ?Pap smear deferred to follow up visit   ?Colonoscopy done 1 year ago-- recommended again in 10 years.  ? ?

## 2021-06-13 LAB — BMP8+ANION GAP
Anion Gap: 15 mmol/L (ref 10.0–18.0)
BUN/Creatinine Ratio: 17 (ref 9–23)
BUN: 10 mg/dL (ref 6–24)
CO2: 21 mmol/L (ref 20–29)
Calcium: 9.3 mg/dL (ref 8.7–10.2)
Chloride: 104 mmol/L (ref 96–106)
Creatinine, Ser: 0.58 mg/dL (ref 0.57–1.00)
Glucose: 155 mg/dL — ABNORMAL HIGH (ref 70–99)
Potassium: 3.9 mmol/L (ref 3.5–5.2)
Sodium: 140 mmol/L (ref 134–144)
eGFR: 112 mL/min/{1.73_m2} (ref >59)

## 2021-06-13 NOTE — Progress Notes (Signed)
Internal Medicine Clinic Attending  Case discussed with Dr. Patel  At the time of the visit.  We reviewed the resident's history and exam and pertinent patient test results.  I agree with the assessment, diagnosis, and plan of care documented in the resident's note.  

## 2021-07-10 ENCOUNTER — Encounter: Payer: Self-pay | Admitting: Internal Medicine

## 2021-07-10 ENCOUNTER — Ambulatory Visit: Payer: BC Managed Care – PPO | Admitting: Internal Medicine

## 2021-07-10 ENCOUNTER — Encounter: Payer: BC Managed Care – PPO | Admitting: Internal Medicine

## 2021-07-10 ENCOUNTER — Other Ambulatory Visit (HOSPITAL_COMMUNITY)
Admission: RE | Admit: 2021-07-10 | Discharge: 2021-07-10 | Disposition: A | Discharge status: Home / Self Care | Payer: BC Managed Care – PPO | Source: Ambulatory Visit | Attending: Internal Medicine | Admitting: Internal Medicine

## 2021-07-10 ENCOUNTER — Other Ambulatory Visit (HOSPITAL_COMMUNITY): Payer: Self-pay

## 2021-07-10 VITALS — BP 141/91 | HR 83 | Temp 98.0°F | Ht 68.0 in | Wt 239.5 lb

## 2021-07-10 DIAGNOSIS — Z0000 Encounter for general adult medical examination without abnormal findings: Secondary | ICD-10-CM

## 2021-07-10 DIAGNOSIS — Z124 Encounter for screening for malignant neoplasm of cervix: Secondary | ICD-10-CM | POA: Insufficient documentation

## 2021-07-10 DIAGNOSIS — I10 Essential (primary) hypertension: Secondary | ICD-10-CM

## 2021-07-10 DIAGNOSIS — E878 Other disorders of electrolyte and fluid balance, not elsewhere classified: Secondary | ICD-10-CM | POA: Diagnosis not present

## 2021-07-10 MED ORDER — AMLODIPINE BESYLATE 10 MG PO TABS
10.0000 mg | ORAL_TABLET | Freq: Every day | ORAL | 11 refills | Status: DC
  Filled 2021-07-10: qty 30, 30d supply, fill #0

## 2021-07-10 NOTE — Patient Instructions (Signed)
Please continue taking your medications  ?Start taking amlodipine every day  ? ?Lab work done today ? ?Please check your blood pressure at home and bring log with you.  ?

## 2021-07-10 NOTE — Progress Notes (Signed)
? ?CC: routine follow up for HTN, DM, and lab work  ? ?HPI: ? ?[name] is a 51 y.o. female with a PMHx stated below and presents today for stated above. Please see the Encounters tab for problem-based Assessment & Plan for additional details.  ? ?Past Medical History:  ?Diagnosis Date  ? Diabetes (HCC)   ? Hypertension   ? ? ?Current Outpatient Medications on File Prior to Visit  ?Medication Sig Dispense Refill  ? dapagliflozin propanediol (FARXIGA) 5 MG TABS tablet Take 2 tablets (10 mg total) by mouth daily before breakfast. 90 tablet 3  ? glucose blood (CONTOUR NEXT TEST) test strip USE TO CHECK BLOOD GLUCOSE TWICE DAILY. 75 strip 5  ? losartan (COZAAR) 25 MG tablet Take 2 tablets (50 mg total) by mouth daily. 90 tablet 1  ? Microlet Lancets MISC USE TO TEST BLOOD SUGAR TWICE DAILY. 100 each 4  ? Microlet Lancets MISC Check blood sugar twice a day. 100 each 7  ? ?No current facility-administered medications on file prior to visit.  ? ? ?Family History  ?Problem Relation Age of Onset  ? Hypertension Mother   ? Hypertension Father   ? Diabetes Mellitus II Father   ? Hypertension Maternal Grandmother   ? Heart failure Maternal Grandmother   ? Lymphoma Maternal Grandmother   ?     big lymph nodes   ? Hypertension Paternal Grandmother   ? Breast cancer Maternal Aunt   ? Thyroid disease Paternal Aunt   ? Colon cancer Neg Hx   ? Stomach cancer Neg Hx   ? Esophageal cancer Neg Hx   ? Pancreatic cancer Neg Hx   ? ? ?Social History  ? ?Socioeconomic History  ? Marital status: Married  ?  Spouse name: Not on file  ? Number of children: Not on file  ? Years of education: Not on file  ? Highest education level: Not on file  ?Occupational History  ? Occupation: early childhood educator  ?Tobacco Use  ? Smoking status: Never  ? Smokeless tobacco: Never  ?Vaping Use  ? Vaping Use: Never used  ?Substance and Sexual Activity  ? Alcohol use: Not Currently  ? Drug use: Never  ? Sexual activity: Not on file  ?Other  Topics Concern  ? Not on file  ?Social History Narrative  ? Not on file  ? ?Social Determinants of Health  ? ?Financial Resource Strain: Not on file  ?Food Insecurity: Not on file  ?Transportation Needs: Not on file  ?Physical Activity: Not on file  ?Stress: Not on file  ?Social Connections: Not on file  ?Intimate Partner Violence: Not on file  ? ? ?Review of Systems: ?ROS negative except for what is noted on the assessment and plan. ? ?Vitals:  ? 07/10/21 1408  ?BP: (!) 141/91  ?Pulse: 83  ?Temp: 98 ?F (36.7 ?C)  ?TempSrc: Oral  ?SpO2: 100%  ?Weight: 239 lb 8 oz (108.6 kg)  ?Height: 5\' 8"  (1.727 m)  ? ? ?Physical Exam: ?Constitutional: alert, well-appearing, in NAD ?Cardiovascular: RRR, no m/r/g, non-edematous bilateral LE ?Pulmonary/Chest: normal work of breathing on RA ?Abdominal: soft, non-tender to palpation, non-distended ?GU: no lesions and no vaginal discharge.  ?Neurological: A&O x 3, follows commands  ?Skin: warm and dry  ?Psych: normal behavior, normal affect  ? ?Assessment & Plan:  ? ?See Encounters Tab for problem based charting. ? ?Patient discussed with Dr. ? ?Criselda Peaches, MD  ?Internal Medicine Resident, PGY-1 ?[facility]  ?

## 2021-07-10 NOTE — Assessment & Plan Note (Addendum)
Pap smear done today ?Refused tdap today  ? ?

## 2021-07-10 NOTE — Assessment & Plan Note (Addendum)
BP 141/91 today, consistent with prior office readings near that range. Is asymptomatic at this time, reporting that she feels well. Losartan was up-titrated to 50 mg during prior visit, and she has been compliant with this and has no adverse effects. She continues to make lifestyle modifications, including daily exercise and healthy diet.  ? ?Given persistently elevated pressures, will add on amlodipine 10 mg at at this time, and continue with losartan 50 mg daily. Continue with lifestyle modifications. BMET obtained today wnl.  ?

## 2021-07-11 LAB — BMP8+ANION GAP
Anion Gap: 16 mmol/L (ref 10.0–18.0)
BUN/Creatinine Ratio: 25 — ABNORMAL HIGH (ref 9–23)
BUN: 14 mg/dL (ref 6–24)
CO2: 23 mmol/L (ref 20–29)
Calcium: 9.6 mg/dL (ref 8.7–10.2)
Chloride: 102 mmol/L (ref 96–106)
Creatinine, Ser: 0.55 mg/dL — ABNORMAL LOW (ref 0.57–1.00)
Glucose: 146 mg/dL — ABNORMAL HIGH (ref 70–99)
Potassium: 3.7 mmol/L (ref 3.5–5.2)
Sodium: 141 mmol/L (ref 134–144)
eGFR: 113 mL/min/{1.73_m2} (ref >59)

## 2021-07-15 LAB — CYTOLOGY - PAP
Comment: NEGATIVE
Diagnosis: NEGATIVE
Diagnosis: REACTIVE
High risk HPV: NEGATIVE

## 2021-07-21 IMAGING — US US THYROID
1 series · 13 of 25 positions shown · non-contrast
Comparison: None.

CLINICAL DATA: Multinodular goiter , thyromegaly

EXAM:
THYROID ULTRASOUND
TECHNIQUE: Ultrasound examination of the thyroid gland and adjacent soft
tissues was performed.

[Series 1: us thyroid · 40 acquisitions, 13 frames shown]
[im 1/40]
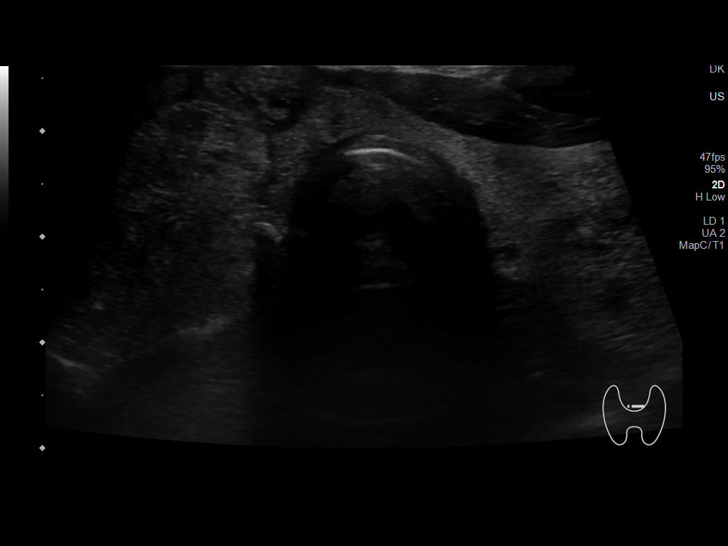
[im 4/40]
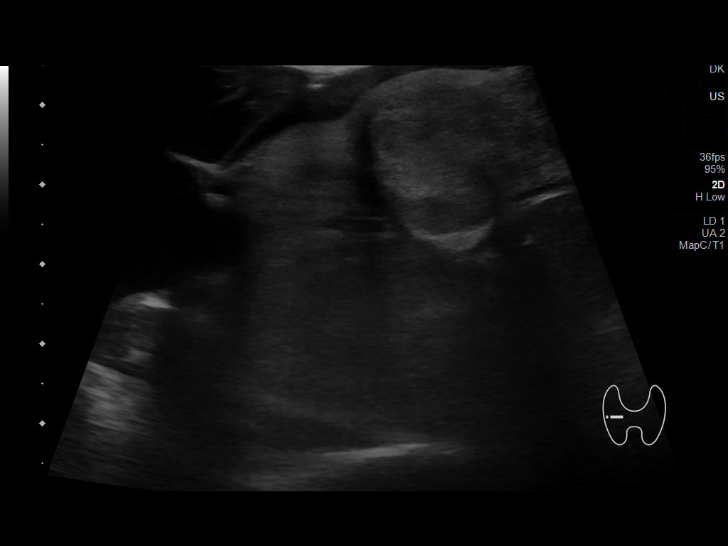
[im 7/40]
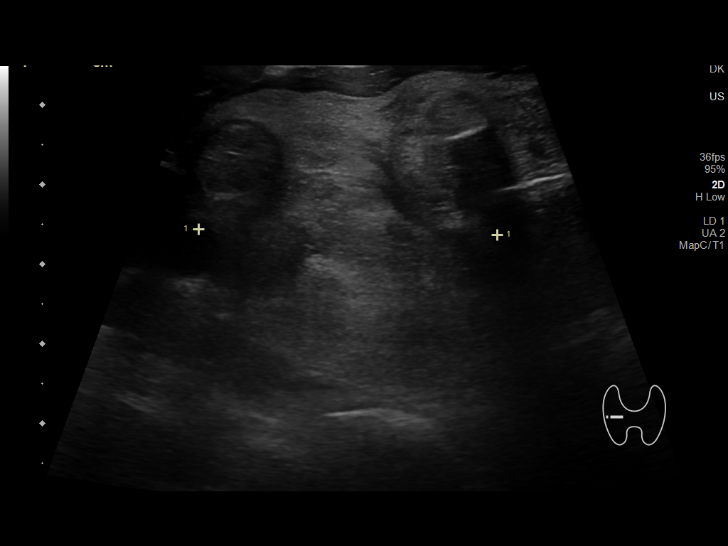
[im 10/40]
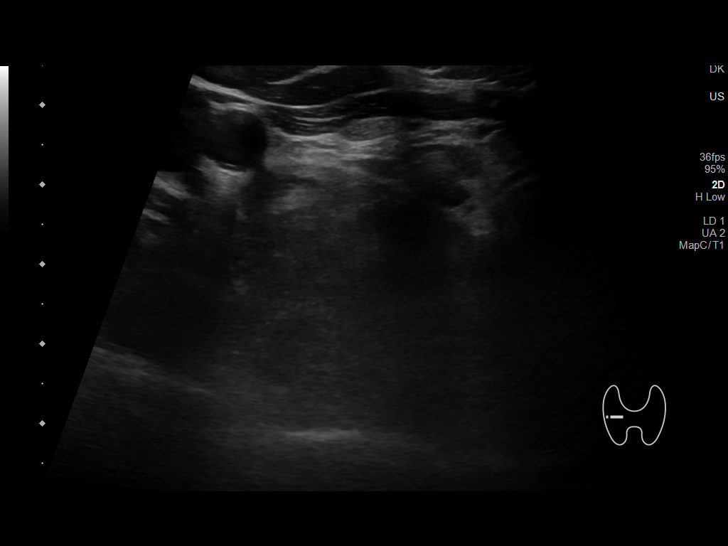
[im 14/40]
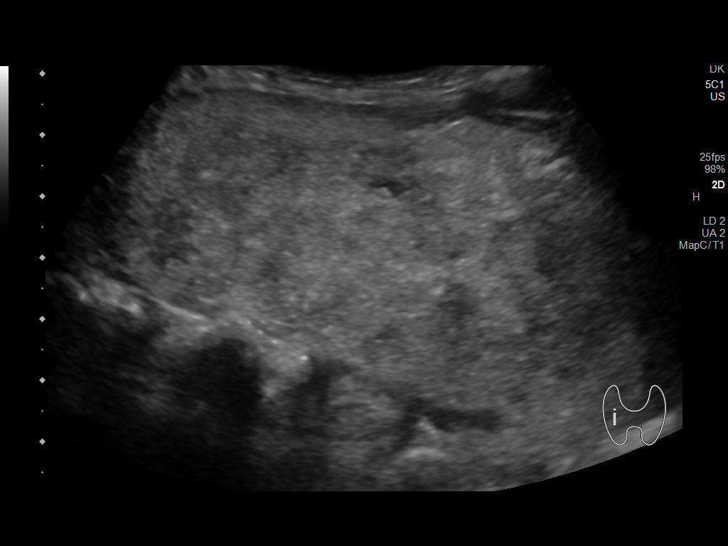
[im 17/40]
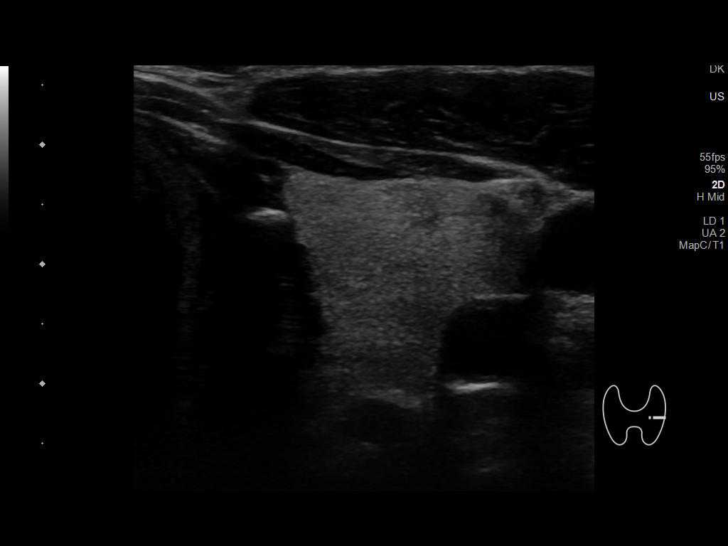
[im 20/40]
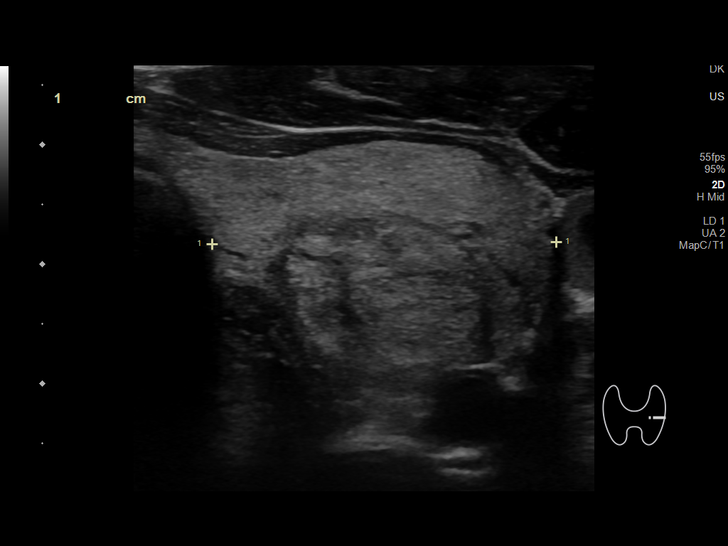
[im 23/40]
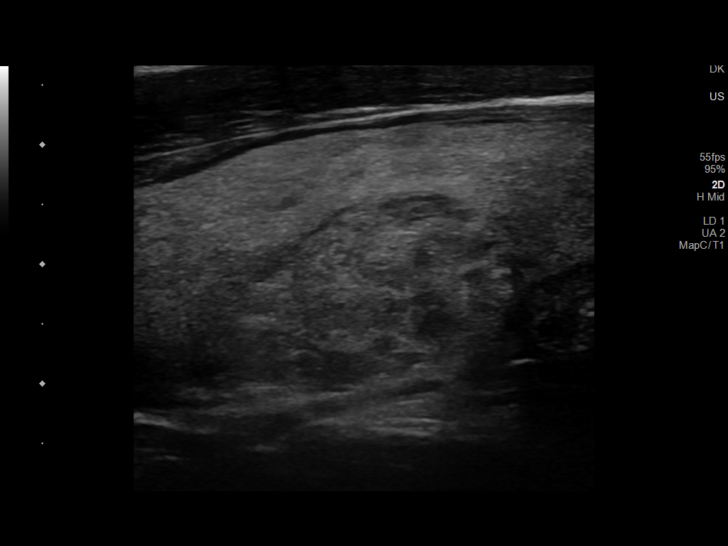
[im 27/40]
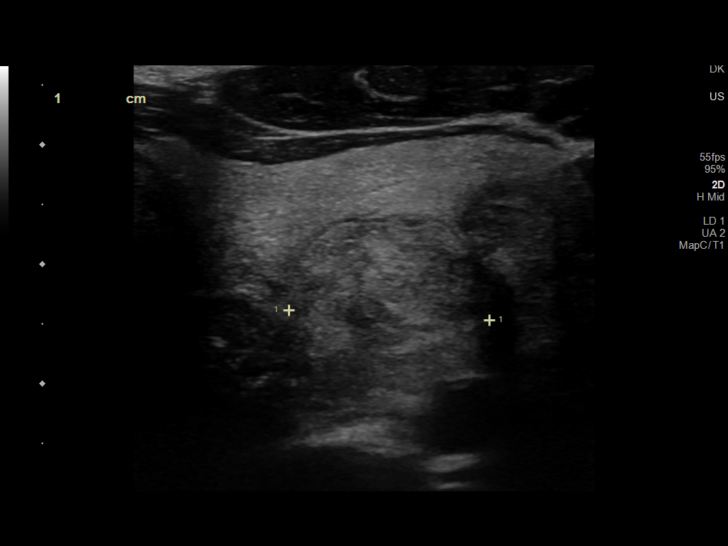
[im 30/40]
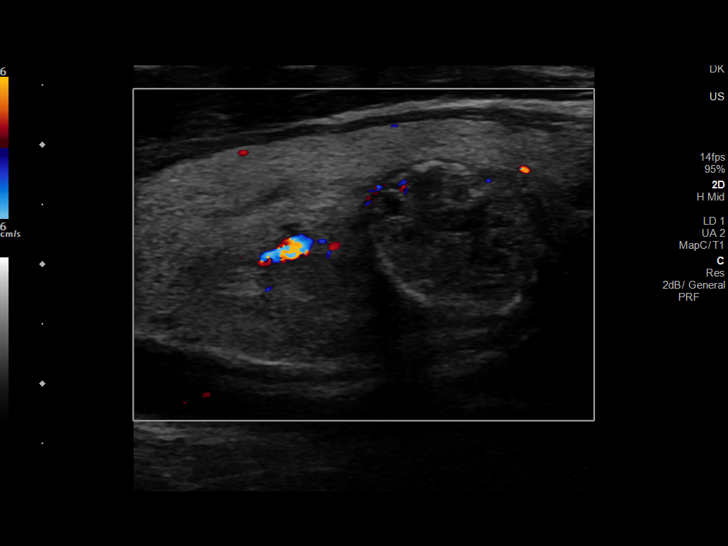
[im 33/40]
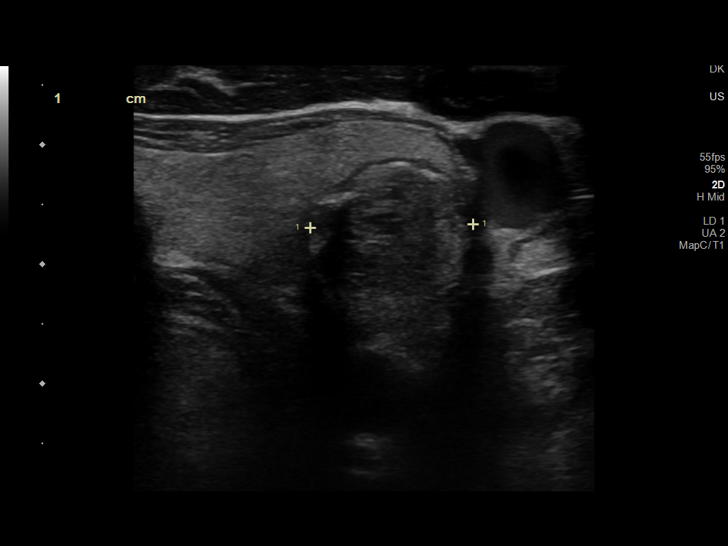
[im 36/40]
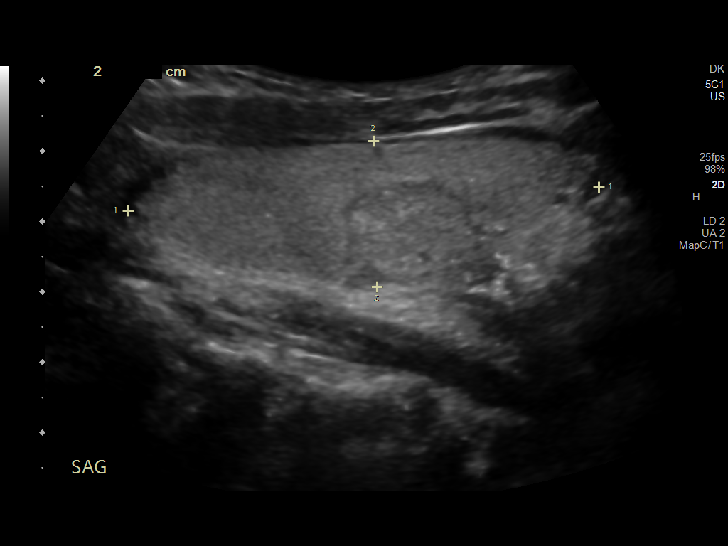
[im 40/40]
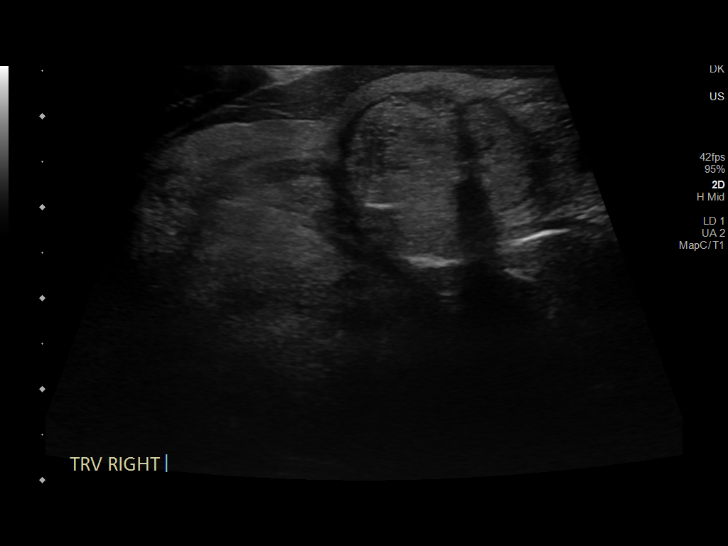

[13 of 25 positions shown; findings below may reference images not displayed]

FINDINGS: Parenchymal Echotexture: Moderately heterogenous

Isthmus: 0.5 cm thickness

Right lobe: 10.3 x 4 x 3.8 cm

Left lobe: 6.7 x 2.1 x 2.9 cm

_________________________________________________________

Estimated total number of nodules >/= 1 cm: 2

Number of spongiform nodules >/=  2 cm not described below (TR1): 0

Number of mixed cystic and solid nodules >/= 1.5 cm not described
below (TR2): 0

_________________________________________________________

The right lobe is more homogeneous than the left with a diffuse
nodular appearance but no discrete defined mass.

Nodule # 1:

Location: Left; Mid

Maximum size: 1.9 cm; Other 2 dimensions: 1.5 x 1.7 cm

Composition: solid/almost completely solid (2)

Echogenicity: isoechoic (1)

Shape: not taller-than-wide (0)

Margins: ill-defined (0)

Echogenic foci: none (0)

ACR TI-RADS total points: 3.

ACR TI-RADS risk category: TR3 (3 points).

ACR TI-RADS recommendations:

*Given size (>/= 1.5 - 2.4 cm) and appearance, a follow-up
ultrasound in 1 year should be considered based on TI-RADS criteria.

_________________________________________________________

Nodule # 2:

Location: Left; Inferior

Maximum size: 1.7 cm; Other 2 dimensions: 1.3 x 1.4 cm

Composition: solid/almost completely solid (2)

Echogenicity: hypoechoic (2)

Shape: not taller-than-wide (0)

Margins: smooth (0)

Echogenic foci: peripheral calcifications (2)

ACR TI-RADS total points: 6.

ACR TI-RADS risk category: TR4 (4-6 points).

ACR TI-RADS recommendations:

**Given size (>/= 1.5 cm) and appearance, fine needle aspiration of
this moderately suspicious nodule should be considered based on
TI-RADS criteria.
IMPRESSION: 1. Thyromegaly with multiple nodules. Recommend FNA biopsy of
moderately suspicious 1.7 cm inferior left nodule.
2. Recommend annual/biennial ultrasound follow-up of mid left nodule
as above, until stability x5 years confirmed.

The above is in keeping with the ACR TI-RADS recommendations - [HOSPITAL] 9541;[DATE].

## 2021-07-24 ENCOUNTER — Other Ambulatory Visit: Payer: Self-pay | Admitting: Student

## 2021-07-24 ENCOUNTER — Other Ambulatory Visit: Payer: Self-pay | Admitting: Internal Medicine

## 2021-07-24 ENCOUNTER — Other Ambulatory Visit (HOSPITAL_COMMUNITY): Payer: Self-pay

## 2021-07-24 MED ORDER — DAPAGLIFLOZIN PROPANEDIOL 5 MG PO TABS
5.0000 mg | ORAL_TABLET | Freq: Every day | ORAL | 3 refills | Status: DC
  Filled 2021-07-24 – 2021-07-31 (×3): qty 90, 90d supply, fill #0

## 2021-07-24 MED ORDER — LOSARTAN POTASSIUM 25 MG PO TABS
25.0000 mg | ORAL_TABLET | Freq: Every day | ORAL | 1 refills | Status: DC
  Filled 2021-07-24 – 2021-07-31 (×3): qty 90, 90d supply, fill #0

## 2021-07-24 NOTE — Telephone Encounter (Signed)
dapagliflozin propanediol (FARXIGA) 5 MG TABS tablet, ? ?losartan (COZAAR) 25 MG tablet, REFILL REQUEST @ [facility]. ? ?Requesting medication to be filled by today, states she is going out of town tomorrow morning.  ?

## 2021-07-30 NOTE — Progress Notes (Signed)
Internal Medicine Clinic Attending  Case discussed with Dr. Patel at the time of the visit.  We reviewed the resident's history and exam and pertinent patient test results.  I agree with the assessment, diagnosis, and plan of care documented in the resident's note.  

## 2021-07-31 ENCOUNTER — Other Ambulatory Visit (HOSPITAL_COMMUNITY): Payer: Self-pay

## 2021-07-31 ENCOUNTER — Telehealth: Payer: Self-pay | Admitting: *Deleted

## 2021-07-31 MED ORDER — DAPAGLIFLOZIN PROPANEDIOL 10 MG PO TABS
10.0000 mg | ORAL_TABLET | Freq: Every day | ORAL | 3 refills | Status: DC
  Filled 2021-07-31: qty 90, 90d supply, fill #0
  Filled 2021-10-31: qty 90, 90d supply, fill #1

## 2021-07-31 MED ORDER — LOSARTAN POTASSIUM 50 MG PO TABS
50.0000 mg | ORAL_TABLET | Freq: Every day | ORAL | 1 refills | Status: DC
  Filled 2021-07-31: qty 90, 90d supply, fill #0

## 2021-07-31 NOTE — Telephone Encounter (Signed)
Patient called in stating her Rxs for Farxiga and losartan are not at [facility]. Both were sent on 4/12. Spoke with Darl Pikes at [facility] who stated they did receive Rxs on 4/12 but it was for Farxiga 5 mg daily and losartan 25 mg daily. Rxs that were sent to [facility] on 3/1 were for Farxiga 10 mg daily and losartan 50 mg daily. These are correct doses per OV note on 3/1. VO given to [facility] for increased doses. She will get thes Rxs ready now. 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 ?

## 2021-08-07 ENCOUNTER — Encounter: Payer: Self-pay | Admitting: Student

## 2021-08-07 ENCOUNTER — Other Ambulatory Visit (HOSPITAL_COMMUNITY): Payer: Self-pay

## 2021-08-07 ENCOUNTER — Ambulatory Visit: Payer: BC Managed Care – PPO | Admitting: Student

## 2021-08-07 VITALS — BP 122/77 | HR 84 | Temp 97.7°F | Ht 68.0 in | Wt 235.7 lb

## 2021-08-07 DIAGNOSIS — E042 Nontoxic multinodular goiter: Secondary | ICD-10-CM | POA: Diagnosis not present

## 2021-08-07 DIAGNOSIS — E1169 Type 2 diabetes mellitus with other specified complication: Secondary | ICD-10-CM | POA: Diagnosis not present

## 2021-08-07 DIAGNOSIS — I10 Essential (primary) hypertension: Secondary | ICD-10-CM

## 2021-08-07 DIAGNOSIS — Z0000 Encounter for general adult medical examination without abnormal findings: Secondary | ICD-10-CM

## 2021-08-07 MED ORDER — OLMESARTAN MEDOXOMIL 40 MG PO TABS
40.0000 mg | ORAL_TABLET | Freq: Every day | ORAL | 2 refills | Status: DC
  Filled 2021-08-07: qty 90, 90d supply, fill #0
  Filled 2021-10-30: qty 90, 90d supply, fill #1

## 2021-08-07 NOTE — Progress Notes (Addendum)
? ?  CC: BP recheck ? ?HPI: ? ?[name] is a 51 y.o. with past history of hypertension, type 2 diabetes who presents to clinic today for blood pressure recheck. ? ?Please see problem by charting for detail. ? ?Past Medical History:  ?Diagnosis Date  ? Diabetes (HCC)   ? Hypertension   ? ?Review of Systems:  per HPI ? ?Physical Exam: ? ?Vitals:  ? 08/07/21 1317 08/07/21 1334  ?BP: (!) 148/94 122/77  ?Pulse: 84 84  ?Temp: 97.7 ?F (36.5 ?C)   ?TempSrc: Oral   ?SpO2: 100%   ?Weight: 235 lb 11.2 oz (106.9 kg)   ?Height: 5\' 8"  (1.727 m)   ? ?Physical Exam ?Constitutional:   ?   General: She is not in acute distress. ?   Appearance: She is not ill-appearing.  ?HENT:  ?   Head: Normocephalic.  ?Eyes:  ?   General: No scleral icterus.    ?   Right eye: No discharge.     ?   Left eye: No discharge.  ?   Conjunctiva/sclera: Conjunctivae normal.  ?Cardiovascular:  ?   Rate and Rhythm: Normal rate and regular rhythm.  ?   Comments: Trace to +1 bilateral ankle edema.  No JVD ?Pulmonary:  ?   Effort: Pulmonary effort is normal. No respiratory distress.  ?   Breath sounds: Normal breath sounds.  ?Musculoskeletal:     ?   General: Normal range of motion.  ?Skin: ?   General: Skin is warm.  ?Neurological:  ?   General: No focal deficit present.  ?   Mental Status: She is alert.  ?Psychiatric:     ?   Mood and Affect: Mood normal.  ?  ? ?Assessment & Plan:  ? ?See Encounters Tab for problem based charting. ? ?Essential hypertension ?Initial blood pressure 148/94.  Repeat 122/77.  Patient report adherence to amlodipine and losartan 50 mg.  She report worsening ankle swelling after starting amlodipine. ? ?Given her blood pressure is well controlled.  Will simplify her regimen by stopping amlodipine and losartan.  We will start olmesartan 40 mg daily. ? ?-BMP in 2 weeks ? ?Type 2 diabetes mellitus (HCC) ?Last A1c of 8.  Her was increased from 5 to 10 mg. ? ?Patient does not want to start an injectable GLP-1 medication.   We also discussed the option of oral Rybelsus.  She does not want to start it now. ? ?Patient has been exercising and eating healthy.  She lost 4 pounds as last visit. ? ?-Repeat A1c in 3 months ? ?Multinodular thyroid ?She has multiple thyroid nodule with the biggest nodule 1.7 cm in the left inferior lobe.  She underwent biopsy which showed atypia of undetermined significance.  Molecular studies showed 4% risk of malignancy.  Recommended repeat thyroid ultrasound in 12 months. ? ?-Repeat thyroid ultrasound ? ?Healthcare maintenance ?She is due for Tdap vaccine.  Patient would like to postpone until next visit  ? ?Patient discussed with Dr. Marcelline Deist  ?

## 2021-08-07 NOTE — Assessment & Plan Note (Addendum)
She has multiple thyroid nodule with the biggest nodule 1.7 cm in the left inferior lobe.  She underwent biopsy which showed atypia of undetermined significance.  Molecular studies showed 4% risk of malignancy.  Recommended repeat thyroid ultrasound in 12 months. ? ?-Repeat thyroid ultrasound ?

## 2021-08-07 NOTE — Patient Instructions (Signed)
Alyssa Buck, ? ?It was nice seeing you in the clinic today. ? ?1.  Your blood pressure is well controlled today.  I will stop the amlodipine and losartan. ? ?I started olmesartan 40 mg daily. ? ?2.  Please continue [place].  Continue exercise and diet.  You are doing a great job. ? ?3.  I will order a ultrasound of your thyroid. ? ?Please return in 2 weeks for repeat blood work. ? ?We will see you back in 3 months in person for repeat A1c. ? ?Dr. Cyndie Chime ?

## 2021-08-07 NOTE — Assessment & Plan Note (Signed)
Last A1c of 8.  Her Wilder Glade was increased from 5 to 10 mg. ? ?Patient does not want to start an injectable GLP-1 medication.  We also discussed the option of oral Rybelsus.  She does not want to start it now. ? ?Patient has been exercising and eating healthy.  She lost 4 pounds as last visit. ? ?-Repeat A1c in 3 months ?

## 2021-08-07 NOTE — Assessment & Plan Note (Addendum)
Initial blood pressure 148/94.  Repeat 122/77.  Patient report adherence to amlodipine and losartan 50 mg.  She report worsening ankle swelling after starting amlodipine. ? ?Given her blood pressure is well controlled.  Will simplify her regimen by stopping amlodipine and losartan.  We will start olmesartan 40 mg daily. ? ?-BMP in 2 weeks ?

## 2021-08-07 NOTE — Assessment & Plan Note (Signed)
She is due for Tdap vaccine.  Patient would like to postpone until next visit ?

## 2021-08-07 NOTE — Progress Notes (Signed)
Internal Medicine Clinic Attending  Case discussed with Dr. Nguyen  At the time of the visit.  We reviewed the resident's history and exam and pertinent patient test results.  I agree with the assessment, diagnosis, and plan of care documented in the resident's note. 

## 2021-08-14 IMAGING — US US FNA BIOPSY THYROID 1ST LESION
1 series · 13 of 18 positions shown · non-contrast
Comparison: Ultrasound thyroid dated 09/27/2019

MEDICATIONS:
6 mL 1% lidocaine

COMPLICATIONS:
None immediate.

INDICATION: Patient with history of multinodular goiter, thyromegaly. Request
for fine-needle aspiration of indeterminate left inferior thyroid
nodule.

EXAM:
ULTRASOUND GUIDED FINE NEEDLE ASPIRATION OF INDETERMINATE THYROID
NODULE
TECHNIQUE: Informed written consent was obtained from the patient after a
discussion of the risks, benefits and alternatives to treatment.
Questions regarding the procedure were encouraged and answered. A
timeout was performed prior to the initiation of the procedure.

[Series 1: us fna biopsy thyroid 1st lesion · 0.06mm/px · 18 acquisitions, 13 frames shown]
[im 1/18]
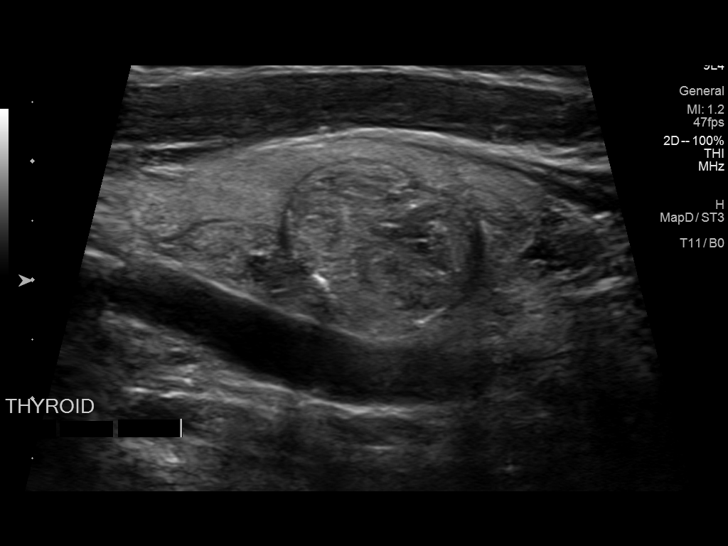
[im 3/18]
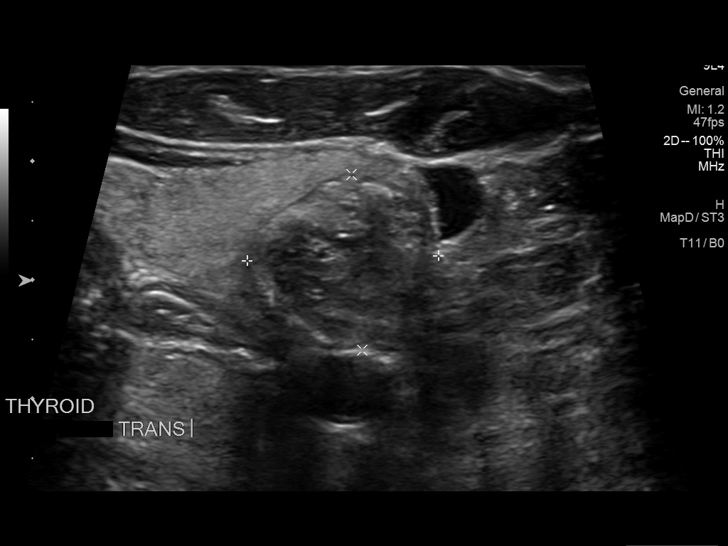
[im 4/18]
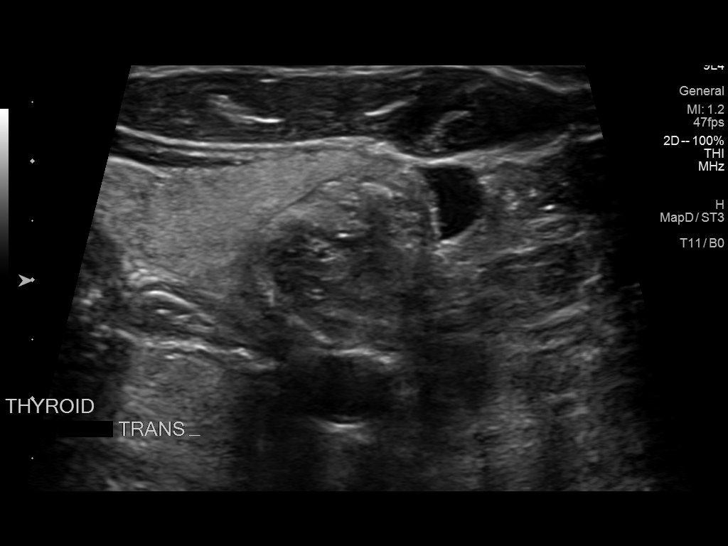
[im 5/18]
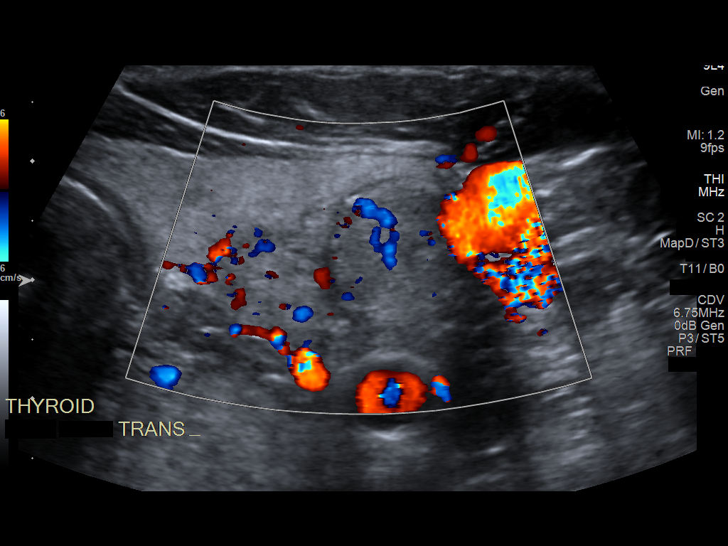
[im 7/18]
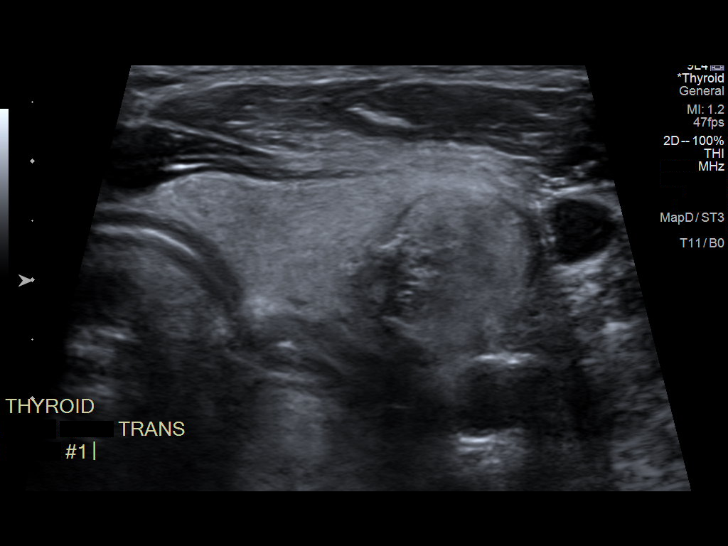
[im 8/18]
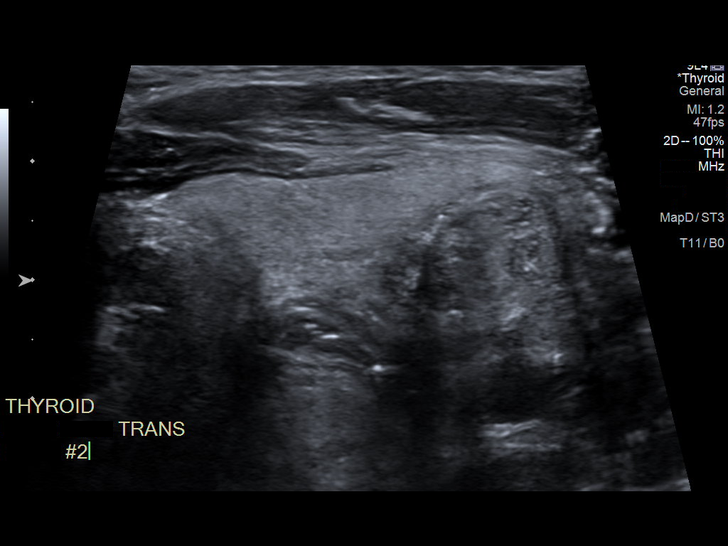
[im 10/18]
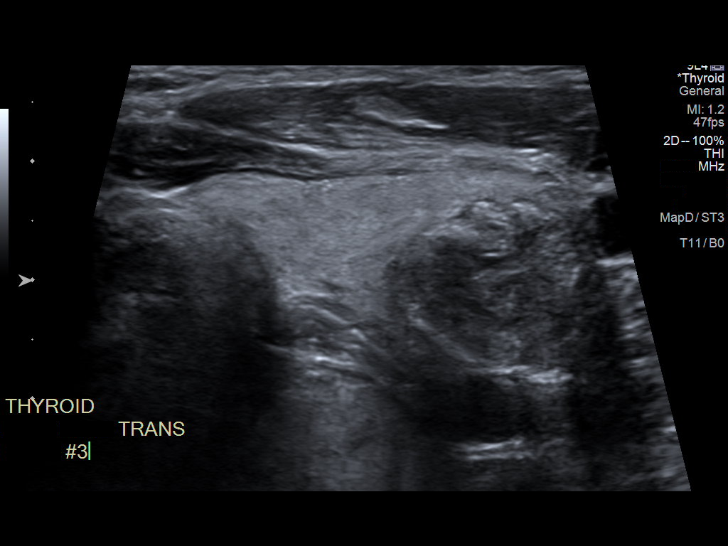
[im 11/18]
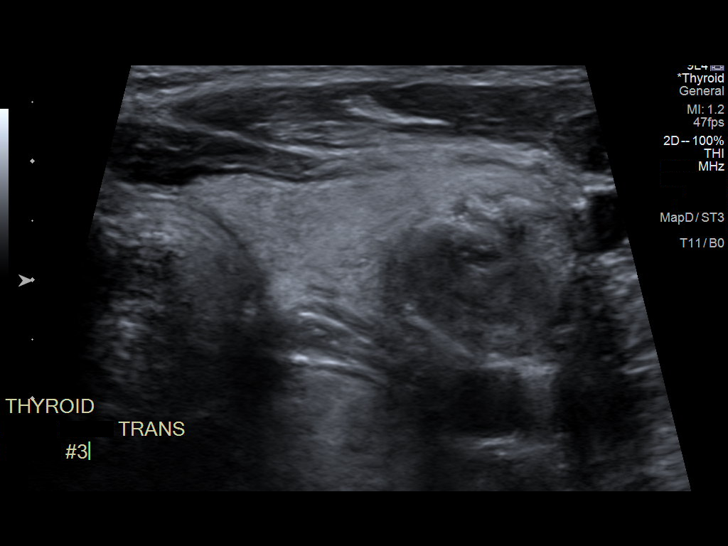
[im 12/18]
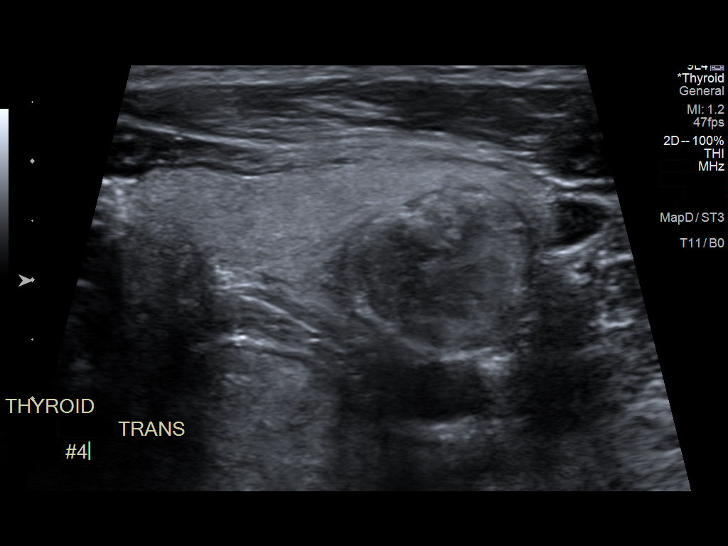
[im 14/18]
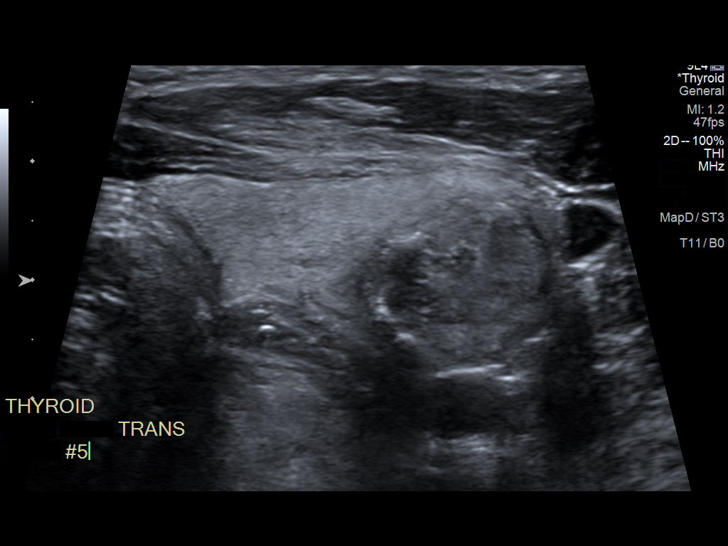
[im 15/18]
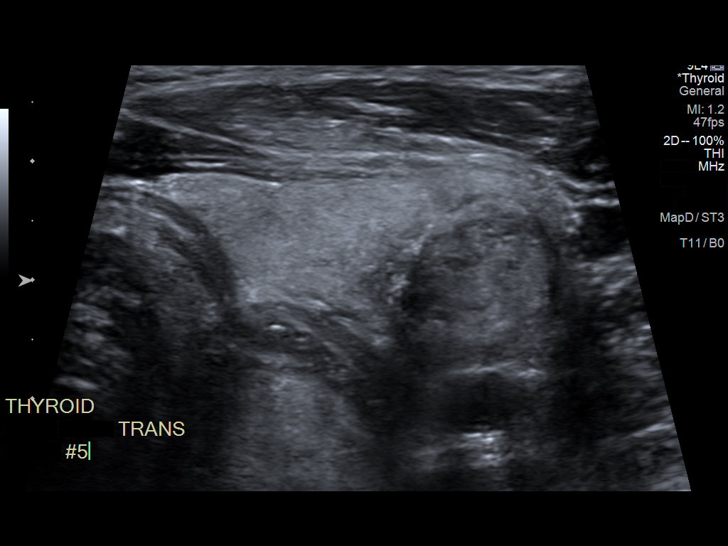
[im 16/18]
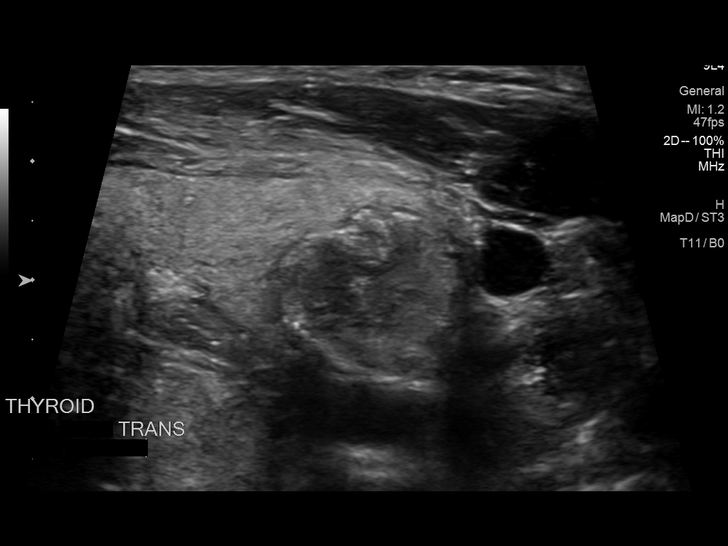
[im 18/18]
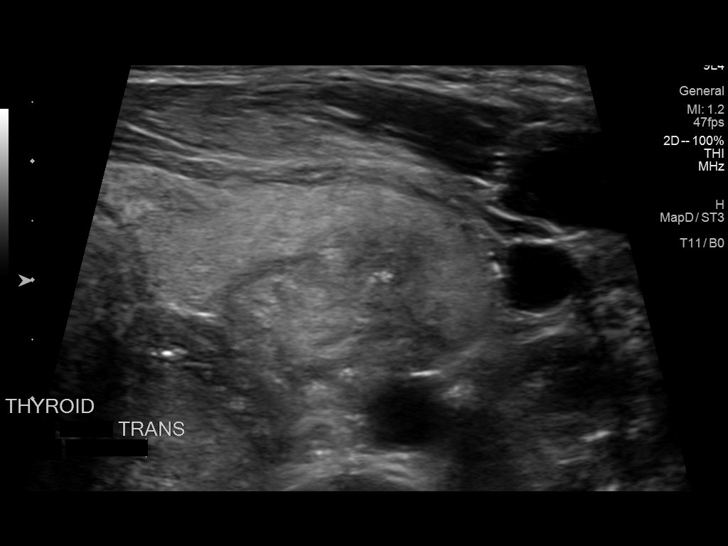

[13 of 18 positions shown; findings below may reference images not displayed]

Pre-procedural ultrasound scanning demonstrated unchanged size and
appearance of the indeterminate nodule within the left inferior
thyroid gland.

The procedure was planned. The neck was prepped in the usual sterile
fashion, and a sterile drape was applied covering the operative
field. A timeout was performed prior to the initiation of the
procedure. Local anesthesia was provided with 1% lidocaine.

Under direct ultrasound guidance, 5 FNA biopsies were performed of
the indeterminate left thyroid nodule with a 25 gauge needle.
Multiple ultrasound images were saved for procedural documentation
purposes. The samples were prepared and submitted to pathology.

Limited post procedural scanning was negative for hematoma or
additional complication. Dressings were placed. The patient
tolerated the above procedures procedure well without immediate
postprocedural complication.
FINDINGS: Nodule reference number based on prior diagnostic ultrasound: 2

Maximum size: 1.7 cm

Location: Left; Inferior

ACR TI-RADS risk category: TR4 (4-6 points)

Reason for biopsy: meets ACR TI-RADS criteria

Ultrasound imaging confirms appropriate placement of the needles
within the thyroid nodule.
IMPRESSION: Technically successful ultrasound guided fine needle aspiration of
indeterminate left inferior thyroid nodule.

Read by Coen, Avishy

## 2021-08-21 ENCOUNTER — Other Ambulatory Visit: Payer: BC Managed Care – PPO

## 2021-08-21 DIAGNOSIS — I10 Essential (primary) hypertension: Secondary | ICD-10-CM

## 2021-08-22 LAB — BMP8+ANION GAP
Anion Gap: 14 mmol/L (ref 10.0–18.0)
BUN/Creatinine Ratio: 21 (ref 9–23)
BUN: 11 mg/dL (ref 6–24)
CO2: 23 mmol/L (ref 20–29)
Calcium: 9.3 mg/dL (ref 8.7–10.2)
Chloride: 103 mmol/L (ref 96–106)
Creatinine, Ser: 0.53 mg/dL — ABNORMAL LOW (ref 0.57–1.00)
Glucose: 155 mg/dL — ABNORMAL HIGH (ref 70–99)
Potassium: 3.8 mmol/L (ref 3.5–5.2)
Sodium: 140 mmol/L (ref 134–144)
eGFR: 114 mL/min/{1.73_m2} (ref >59)

## 2021-09-05 ENCOUNTER — Ambulatory Visit (HOSPITAL_COMMUNITY): Payer: BC Managed Care – PPO

## 2021-09-06 DIAGNOSIS — H43823 Vitreomacular adhesion, bilateral: Secondary | ICD-10-CM | POA: Diagnosis not present

## 2021-09-06 DIAGNOSIS — H35033 Hypertensive retinopathy, bilateral: Secondary | ICD-10-CM | POA: Diagnosis not present

## 2021-09-06 DIAGNOSIS — E113513 Type 2 diabetes mellitus with proliferative diabetic retinopathy with macular edema, bilateral: Secondary | ICD-10-CM | POA: Diagnosis not present

## 2021-09-06 DIAGNOSIS — H31093 Other chorioretinal scars, bilateral: Secondary | ICD-10-CM | POA: Diagnosis not present

## 2021-09-26 DIAGNOSIS — H5203 Hypermetropia, bilateral: Secondary | ICD-10-CM | POA: Diagnosis not present

## 2021-10-06 ENCOUNTER — Encounter: Payer: Self-pay | Admitting: *Deleted

## 2021-10-30 ENCOUNTER — Other Ambulatory Visit: Payer: Self-pay

## 2021-10-31 ENCOUNTER — Other Ambulatory Visit (HOSPITAL_COMMUNITY): Payer: Self-pay

## 2021-10-31 ENCOUNTER — Other Ambulatory Visit: Payer: Self-pay | Admitting: Internal Medicine

## 2021-10-31 MED ORDER — MICROLET LANCETS MISC
Freq: Two times a day (BID) | 4 refills | Status: AC
  Filled 2021-10-31 – 2022-03-24 (×2): qty 100, 50d supply, fill #0

## 2022-01-07 DIAGNOSIS — E113513 Type 2 diabetes mellitus with proliferative diabetic retinopathy with macular edema, bilateral: Secondary | ICD-10-CM | POA: Diagnosis not present

## 2022-01-07 DIAGNOSIS — H21541 Posterior synechiae (iris), right eye: Secondary | ICD-10-CM | POA: Diagnosis not present

## 2022-01-07 DIAGNOSIS — H35033 Hypertensive retinopathy, bilateral: Secondary | ICD-10-CM | POA: Diagnosis not present

## 2022-01-07 DIAGNOSIS — H43823 Vitreomacular adhesion, bilateral: Secondary | ICD-10-CM | POA: Diagnosis not present

## 2022-01-08 ENCOUNTER — Encounter: Payer: BC Managed Care – PPO | Admitting: Family Medicine

## 2022-01-20 ENCOUNTER — Ambulatory Visit: Payer: BC Managed Care – PPO | Admitting: Family Medicine

## 2022-01-20 ENCOUNTER — Encounter: Payer: Self-pay | Admitting: Family Medicine

## 2022-01-20 ENCOUNTER — Other Ambulatory Visit (HOSPITAL_COMMUNITY): Payer: Self-pay

## 2022-01-20 VITALS — BP 144/94 | HR 83 | Temp 98.2°F | Ht 68.0 in | Wt 239.3 lb

## 2022-01-20 DIAGNOSIS — M25562 Pain in left knee: Secondary | ICD-10-CM

## 2022-01-20 DIAGNOSIS — Z0000 Encounter for general adult medical examination without abnormal findings: Secondary | ICD-10-CM

## 2022-01-20 DIAGNOSIS — E042 Nontoxic multinodular goiter: Secondary | ICD-10-CM

## 2022-01-20 DIAGNOSIS — E119 Type 2 diabetes mellitus without complications: Secondary | ICD-10-CM

## 2022-01-20 DIAGNOSIS — M25561 Pain in right knee: Secondary | ICD-10-CM | POA: Insufficient documentation

## 2022-01-20 DIAGNOSIS — Z3202 Encounter for pregnancy test, result negative: Secondary | ICD-10-CM

## 2022-01-20 DIAGNOSIS — I10 Essential (primary) hypertension: Secondary | ICD-10-CM

## 2022-01-20 LAB — POCT URINE PREGNANCY: Preg Test, Ur: NEGATIVE

## 2022-01-20 LAB — GLUCOSE, CAPILLARY: Glucose-Capillary: 177 mg/dL — ABNORMAL HIGH (ref 70–99)

## 2022-01-20 LAB — POCT GLYCOSYLATED HEMOGLOBIN (HGB A1C): Hemoglobin A1C: 7.9 % — AB (ref 4.0–5.6)

## 2022-01-20 MED ORDER — DAPAGLIFLOZIN PROPANEDIOL 10 MG PO TABS
10.0000 mg | ORAL_TABLET | Freq: Every day | ORAL | 2 refills | Status: DC
  Filled 2022-01-20: qty 30, 30d supply, fill #0
  Filled 2022-02-27: qty 30, 30d supply, fill #1
  Filled 2022-03-24: qty 30, 30d supply, fill #2

## 2022-01-20 MED ORDER — OLMESARTAN MEDOXOMIL 40 MG PO TABS
40.0000 mg | ORAL_TABLET | Freq: Every day | ORAL | 2 refills | Status: DC
  Filled 2022-01-20: qty 90, 90d supply, fill #0

## 2022-01-20 NOTE — Progress Notes (Signed)
   CC: Follow-up for blood pressure, diabetes and complaining of bilateral knee pain  HPI: [name] is a 51 y.o. female with past medical history listed below presenting [facility] for diabetes and blood pressure check.   On today's visit her blood pressure is 144/94, repeat 147/89.  She denies headache, dizziness, chest pain or visual changes. She is also complaining of bilateral knee pain for 6 months.  Denies injury or trauma. For details of today's visit and the status of his chronic medical issues please refer to the assessment and plan.   Past Medical History:  Diagnosis Date   Diabetes ([place])    Hypertension    Review of Systems: Review of Systems  Constitutional:  Negative for fever.  Respiratory:  Negative for shortness of breath.   Cardiovascular:  Negative for chest pain.  Musculoskeletal:  Positive for joint pain (Bilateral knee pain for 6 months.  No injury or trauma).  Skin:  Negative for rash.  Neurological:  Negative for dizziness and headaches.  Psychiatric/Behavioral: Negative.       Physical Exam: Physical Exam Vitals and nursing note reviewed.  Constitutional:      Appearance: Normal appearance.  HENT:     Head: Normocephalic.  Eyes:     Conjunctiva/sclera: Conjunctivae normal.  Cardiovascular:     Rate and Rhythm: Normal rate and regular rhythm.     Heart sounds: No murmur heard.    No gallop.  Pulmonary:     Breath sounds: No wheezing or rales.  Musculoskeletal:        General: Tenderness (No visible swelling or deformity. Full range of motion in both legs. Crepitus noted in both knees, right worse than left.  Joint line tenderness to palpation to both knees.  Valgus and varus stress test negative bilaterally.  No laxity in the knee joints.) present. No swelling.     Cervical back: Neck supple.     Comments: Small cyst noted to palpation to posterior aspect of left knee.  Mildly tender to palpation.  Strength equal bilaterally lower extremities.   Motor and sensory function intact.  Pedal pulses 2+ and intact bilaterally  Skin:    General: Skin is warm.     Capillary Refill: Capillary refill takes less than 2 seconds.  Neurological:     Mental Status: She is alert and oriented to person, place, and time.  Psychiatric:        Mood and Affect: Mood normal.        Behavior: Behavior normal.      Vitals:   01/20/22 1316  BP: (!) 144/94  Pulse: 83  Temp: 98.2 F (36.8 C)  TempSrc: Oral  SpO2: 100%  Weight: 239 lb 4.8 oz (108.5 kg)  Height: 5\' 8"  (1.727 m)    Assessment & Plan:   See Encounters Tab for problem based charting.  Patient seen with Dr. Evette Doffing

## 2022-01-20 NOTE — Assessment & Plan Note (Signed)
Patient presented to clinic for hypertension follow-up.  Patient is taking olmesartan 40 mg.  Her blood pressure in the clinic recorded 144/94 and repeat blood pressure 147/89.  Patient denies headache, dizziness, visual changes, chest pain or chest tightness.  Patient reports she takes the medication as prescribed.  She does not miss even a single dose.  Her home blood pressure reading runs 120-125/85-87. Since patient's blood pressure reading within normal limit at home patient was advised to continue to monitor the blood pressure at home and keep the blood pressure log for 2 weeks before she follow-up on her next visit.  Patient was also encouraged to exercise 30 minutes 5 times a week and diet modification to help lose weight.  Patient agreed not to make any changes to her medication today.  She will start working on diet and exercise.  We will follow-up in 3 months.

## 2022-01-20 NOTE — Patient Instructions (Signed)
Continue olmesartan.  Continue to monitor blood pressure at home.  Keep the blood pressure for 2 weeks before your next appointment. I will make a referral for diabetic educator. We will also make the referral for mammogram. Follow-up in 3 months

## 2022-01-20 NOTE — Assessment & Plan Note (Signed)
Patient with history of multi nodular thyroid.  She had a thyroid ultrasound with biopsy in July 2021.  Ultrasound noted to thyroid nodule measuring 1.7 and 1.9 cm and left thyroid gland.  Patient was recommended to repeat ultrasound in 12 months.  On her previous visit in April the ultrasound order was placed but patient did not get the ultrasound.  Patient was encouraged to call the imaging place to schedule an appointment for ultrasound of the thyroid gland.  Patient agrees with the plan.

## 2022-01-20 NOTE — Assessment & Plan Note (Signed)
Patient is on Farxiga 10 mg for diabetes.  Her hemoglobin A1c today is 7.9.  Her hemoglobin A1c in March 2023 was 8.0.  We would like to see her A1c below 7.  Adding an additional medication to her treatment regimen for better glycemic control was discussed.  The plan was to add a GLP-1 for better diabetes control and also help with the weight loss.  Patient stated she is not ready to add additional medication at this time.  She is hesitant about injectable medication.  Referral to diabetic educator was placed in the chart and patient was informed about the same.  Patient will get better understanding about the different options after she will speak to the diabetic educator.  We will follow-up with the patient in 3 months and discuss the treatment options again.

## 2022-01-21 NOTE — Progress Notes (Signed)
Internal Medicine Clinic Attending  I saw and evaluated the patient.  I personally confirmed the key portions of the history and exam documented by Dr. Multani and I reviewed pertinent patient test results.  The assessment, diagnosis, and plan were formulated together and I agree with the documentation in the resident's note.  

## 2022-01-28 ENCOUNTER — Other Ambulatory Visit: Payer: Self-pay | Admitting: Family Medicine

## 2022-01-28 DIAGNOSIS — Z1231 Encounter for screening mammogram for malignant neoplasm of breast: Secondary | ICD-10-CM

## 2022-02-06 ENCOUNTER — Ambulatory Visit: Payer: BC Managed Care – PPO

## 2022-02-27 ENCOUNTER — Other Ambulatory Visit (HOSPITAL_COMMUNITY): Payer: Self-pay

## 2022-03-24 ENCOUNTER — Other Ambulatory Visit (HOSPITAL_COMMUNITY): Payer: Self-pay

## 2022-03-24 ENCOUNTER — Other Ambulatory Visit: Payer: Self-pay

## 2022-04-01 ENCOUNTER — Ambulatory Visit: Payer: BC Managed Care – PPO

## 2022-04-21 ENCOUNTER — Encounter: Payer: BC Managed Care – PPO | Admitting: Student

## 2022-04-23 ENCOUNTER — Other Ambulatory Visit: Payer: Self-pay

## 2022-04-24 ENCOUNTER — Other Ambulatory Visit: Payer: Self-pay | Admitting: Family Medicine

## 2022-04-24 ENCOUNTER — Other Ambulatory Visit (HOSPITAL_COMMUNITY): Payer: Self-pay

## 2022-04-24 DIAGNOSIS — I10 Essential (primary) hypertension: Secondary | ICD-10-CM

## 2022-04-24 DIAGNOSIS — E119 Type 2 diabetes mellitus without complications: Secondary | ICD-10-CM

## 2022-04-25 ENCOUNTER — Other Ambulatory Visit (HOSPITAL_COMMUNITY): Payer: Self-pay

## 2022-04-25 MED ORDER — DAPAGLIFLOZIN PROPANEDIOL 10 MG PO TABS
10.0000 mg | ORAL_TABLET | Freq: Every day | ORAL | 2 refills | Status: DC
  Filled 2022-04-25: qty 30, 30d supply, fill #0

## 2022-04-25 MED ORDER — OLMESARTAN MEDOXOMIL 40 MG PO TABS
40.0000 mg | ORAL_TABLET | Freq: Every day | ORAL | 2 refills | Status: DC
  Filled 2022-04-25: qty 30, 30d supply, fill #0

## 2022-04-30 ENCOUNTER — Other Ambulatory Visit: Payer: Self-pay

## 2022-04-30 ENCOUNTER — Other Ambulatory Visit (HOSPITAL_COMMUNITY): Payer: Self-pay

## 2022-04-30 ENCOUNTER — Other Ambulatory Visit (HOSPITAL_COMMUNITY)
Admission: RE | Admit: 2022-04-30 | Discharge: 2022-04-30 | Disposition: A | Discharge status: Home / Self Care | Payer: 59 | Source: Ambulatory Visit | Attending: Internal Medicine | Admitting: Internal Medicine

## 2022-04-30 ENCOUNTER — Ambulatory Visit: Payer: 59 | Admitting: Student

## 2022-04-30 VITALS — BP 150/93 | HR 84 | Temp 97.9°F | Resp 28 | Ht 68.0 in | Wt 241.0 lb

## 2022-04-30 DIAGNOSIS — E669 Obesity, unspecified: Secondary | ICD-10-CM

## 2022-04-30 DIAGNOSIS — I10 Essential (primary) hypertension: Secondary | ICD-10-CM | POA: Diagnosis not present

## 2022-04-30 DIAGNOSIS — E782 Mixed hyperlipidemia: Secondary | ICD-10-CM

## 2022-04-30 DIAGNOSIS — E119 Type 2 diabetes mellitus without complications: Secondary | ICD-10-CM

## 2022-04-30 DIAGNOSIS — E1169 Type 2 diabetes mellitus with other specified complication: Secondary | ICD-10-CM | POA: Diagnosis not present

## 2022-04-30 DIAGNOSIS — Z113 Encounter for screening for infections with a predominantly sexual mode of transmission: Secondary | ICD-10-CM | POA: Diagnosis present

## 2022-04-30 DIAGNOSIS — Z7984 Long term (current) use of oral hypoglycemic drugs: Secondary | ICD-10-CM

## 2022-04-30 DIAGNOSIS — E1165 Type 2 diabetes mellitus with hyperglycemia: Secondary | ICD-10-CM

## 2022-04-30 DIAGNOSIS — E042 Nontoxic multinodular goiter: Secondary | ICD-10-CM | POA: Diagnosis not present

## 2022-04-30 DIAGNOSIS — Z794 Long term (current) use of insulin: Secondary | ICD-10-CM

## 2022-04-30 LAB — POCT GLYCOSYLATED HEMOGLOBIN (HGB A1C): Hemoglobin A1C: 7.9 % — AB (ref 4.0–5.6)

## 2022-04-30 LAB — GLUCOSE, CAPILLARY: Glucose-Capillary: 189 mg/dL — ABNORMAL HIGH (ref 70–99)

## 2022-04-30 MED ORDER — SEMAGLUTIDE(0.25 OR 0.5MG/DOS) 2 MG/3ML ~~LOC~~ SOPN
PEN_INJECTOR | SUBCUTANEOUS | 1 refills | Status: DC
  Filled 2022-04-30: qty 3, 42d supply, fill #0
  Filled 2022-05-22 – 2022-06-27 (×3): qty 3, 42d supply, fill #1

## 2022-04-30 NOTE — Patient Instructions (Addendum)
Thank you, Ms.Nihira E Fant for allowing [place] to provide your care today. Today we discussed your blood pressure, diabetes, multinodular thyroid, cholesterol and STI screenings.    For your diabetes, your A1c remains the same so I am starting you on Ozempic injection weekly.  Continue working on your diet and exercise to help improve your blood sugars.  For your blood pressure, please check your blood pressure at home and bring the log to your next office visit.  For your multinodular goiter, call the imaging center to have this scheduled with your mammogram.  I have ordered the following labs for you:   Lab Orders         Glucose, capillary         HIV antibody (with reflex)         Microalbumin / Creatinine Urine Ratio         Lipid Profile         BMP8+Anion Gap         TSH         POC Hbg A1C      I will call if any are abnormal. All of your labs can be accessed through "My Chart".  I have place a referrals to supervised exercise program  I have ordered the following medication/changed the following medications:  Start Ozempic 0.25 mg weekly  My Chart Access: [email or web address]  Please follow-up with Dr. Coy Saunas in 2 weeks  Please make sure to arrive 15 minutes prior to your next appointment. If you arrive late, you may be asked to reschedule.    We look forward to seeing you next time. Please call our clinic at [phone] if you have any questions or concerns. The best time to call is Monday-Friday from 9am-4pm, but there is someone available 24/7. If after hours or the weekend, call the main hospital number and ask for the Internal Medicine Resident On-Call. If you need medication refills, please notify your pharmacy one week in advance and they will send [place] a request.   Thank you for letting us take part in your care. Wishing you the best!  Lacinda Axon, MD 04/30/2022, 2:53 PM IM Resident, PGY-3 [place] Milian 41:10

## 2022-04-30 NOTE — Progress Notes (Signed)
CC: Follow-up on diabetes/HTN  HPI:  [name] How is a 52 y.o. female with PMH as below who presents to clinic to follow-up on her chronic medical problems. Please see problem based charting for evaluation, assessment and plan.  Past Medical History:  Diagnosis Date   Diabetes ([place])    Hypertension     Review of Systems:  Constitutional: Negative for fever or fatigue Eyes: Negative for visual changes Cardiac: Negative for palpitations Neuro: Negative for headache or weakness Psych: Positive for stress. Negative for anxiety or depression  Physical Exam: General: Pleasant, well-appearing middle-aged woman. No acute distress. Neck: Mild thyromegaly. Normal ROM.  No lymphadenopathy. Cardiac: RRR. No murmurs, rubs or gallops. No LE edema Respiratory: Lungs CTAB. No wheezing or crackles. Abdominal: Soft, symmetric and non tender. Normal BS. Skin: Warm, dry and intact without rashes or lesions MSK: Mild tenderness to palpation bilateral knee. Extremities: Atraumatic. Full ROM. Palpable radial and DP pulses. Neuro: A&O x 3. Moves all extremities. Normal sensation to gross touch. Psych: Appropriate mood and affect.  Vitals:   04/30/22 1347  BP: (!) 141/82  Pulse: 79  Resp: (!) 28  Temp: 97.9 F (36.6 C)  TempSrc: Oral  SpO2: 100%  Weight: 241 lb (109.3 kg)  Height: 5\' 8"  (1.727 m)    Assessment & Plan:   Type 2 diabetes mellitus (HCC) Last A1c 7.9% 3 months ago. A1c today unchanged at 7.9%. During last office visit, patient was not interested in starting Ozempic because she wanted to try to lose the weight on her own. Her weight is now up 2 lbs the last 3 months. She has not tolerated metformin in the past. She does not like the idea of injecting herself every week but willing to try Ozempic now to see if she tolerates it.  Also counseled patient on dietary changes and advised her to reduce her intake of processed foods, sugary foods and foods high in carbs. Patient  also agreeable to referral to the supervised exercise program. MA/CR slightly elevated to 39.  Plan: -Continue Farxiga 10 mg daily -Start Ozempic 0.25 mg weekly injections for 4 weeks then 0.5 mg weekly -Referral to [email or web address] -Follow-up in 2 weeks -Repeat A1c in 3 months  Essential hypertension Patient here for follow-up on her BP. Currently on olmesartan 40 mg daily.  She endorsed recent stressors such as her mother passing away 3 months ago and her husband leaving her around the same time. States she is nervous about this. She denies any headaches, dizziness or changes in her vision. Blood pressure today is elevated with SBP in the 140s to 150s. States she did not bring her BP log her blood pressures are being stable at home with SBP in the 120s to 130s. Discussed plan with patient to check her blood pressure again at home for 2 weeks and follow-up for reevaluation before adjusting her medication. Patient agreeable to this plan. BMP today shows normal kidney function.  Plan: -Continue olmesartan 40 mg daily -Provided BP log to check BP daily for 2 weeks -Follow-up in 2 weeks for BP check  Routine screening for STI (sexually transmitted infection) Patient states her husband will more than 10 years suddenly told her he wanted a divorce 2 months ago around the same time that her mother passed away. She does not have any vaginal or urinary symptoms but she wants to get tested for STDs to make sure he did not give her anything.  STI screening negative for HIV, GC/chlamydia and  trichomonas.  -Patient called and informed of the results  Multinodular thyroid Multinodular thyroid Patient's thyroid ultrasound still pending. Discussed with patient about calling the imaging center to see if she can complete this at the same time as her mammogram next month. She denies any palpitations, fatigue, anxiety, tremors or unintentional weight loss. TSH today is within normal limits. -Pending thyroid  ultrasound  Hyperlipidemia Lipid panel today shows total cholesterol 221, triglyceride 119, HDL 39 and LDL 160. In the setting of patient's diabetes, moderate to high intensity statin is recommended. Discussed with patient who informed me that she is not ready to start a new medication at this point but she will work on her diet as well as losing weight to help improve her cholesterol.  Plan: -Counseled on lifestyle modifications with dietary changes such as reducing intake of red meat, processed and fatty foods. -Referred to the P.R.[facility].P classes -Repeat lipid panel in 6 months   See Encounters Tab for problem based charting.  Patient discussed with Dr. Lorenz Coaster, MD, MPH

## 2022-05-01 ENCOUNTER — Other Ambulatory Visit (HOSPITAL_COMMUNITY): Payer: Self-pay

## 2022-05-01 LAB — BMP8+ANION GAP
Anion Gap: 18 mmol/L (ref 10.0–18.0)
BUN/Creatinine Ratio: 26 — ABNORMAL HIGH (ref 9–23)
BUN: 15 mg/dL (ref 6–24)
CO2: 21 mmol/L (ref 20–29)
Calcium: 9.6 mg/dL (ref 8.7–10.2)
Chloride: 104 mmol/L (ref 96–106)
Creatinine, Ser: 0.58 mg/dL (ref 0.57–1.00)
Glucose: 163 mg/dL — ABNORMAL HIGH (ref 70–99)
Potassium: 4 mmol/L (ref 3.5–5.2)
Sodium: 143 mmol/L (ref 134–144)
eGFR: 111 mL/min/{1.73_m2} (ref >59)

## 2022-05-01 LAB — MICROALBUMIN / CREATININE URINE RATIO
Creatinine, Urine: 54.2 mg/dL
Microalb/Creat Ratio: 39 mg/g creat — ABNORMAL HIGH (ref 0–29)
Microalbumin, Urine: 21.1 ug/mL

## 2022-05-01 LAB — TSH: TSH: 1.05 u[IU]/mL (ref 0.450–4.500)

## 2022-05-01 LAB — URINE CYTOLOGY ANCILLARY ONLY
Chlamydia: NEGATIVE
Comment: NEGATIVE
Comment: NEGATIVE
Comment: NORMAL
Neisseria Gonorrhea: NEGATIVE
Trichomonas: NEGATIVE

## 2022-05-01 LAB — LIPID PANEL
Chol/HDL Ratio: 5.7 ratio — ABNORMAL HIGH (ref 0.0–4.4)
Cholesterol, Total: 221 mg/dL — ABNORMAL HIGH (ref 100–199)
HDL: 39 mg/dL — ABNORMAL LOW (ref >39)
LDL Chol Calc (NIH): 160 mg/dL — ABNORMAL HIGH (ref 0–99)
Triglycerides: 119 mg/dL (ref 0–149)
VLDL Cholesterol Cal: 22 mg/dL (ref 5–40)

## 2022-05-01 LAB — HIV ANTIBODY (ROUTINE TESTING W REFLEX): HIV Screen 4th Generation wRfx: NONREACTIVE

## 2022-05-02 ENCOUNTER — Encounter: Payer: Self-pay | Admitting: Student

## 2022-05-02 ENCOUNTER — Telehealth: Payer: Self-pay

## 2022-05-02 DIAGNOSIS — E785 Hyperlipidemia, unspecified: Secondary | ICD-10-CM | POA: Insufficient documentation

## 2022-05-02 DIAGNOSIS — Z113 Encounter for screening for infections with a predominantly sexual mode of transmission: Secondary | ICD-10-CM | POA: Insufficient documentation

## 2022-05-02 DIAGNOSIS — Z0000 Encounter for general adult medical examination without abnormal findings: Secondary | ICD-10-CM | POA: Insufficient documentation

## 2022-05-02 NOTE — Telephone Encounter (Signed)
Called re: PREP program referral. Unable to talk, sent me a text message that she would call me back

## 2022-05-02 NOTE — Assessment & Plan Note (Addendum)
Patient here for follow-up on her BP. Currently on olmesartan 40 mg daily.  She endorsed recent stressors such as her mother passing away 3 months ago and her husband leaving her around the same time. States she is nervous about this. She denies any headaches, dizziness or changes in her vision. Blood pressure today is elevated with SBP in the 140s to 150s. States she did not bring her BP log her blood pressures are being stable at home with SBP in the 120s to 130s. Discussed plan with patient to check her blood pressure again at home for 2 weeks and follow-up for reevaluation before adjusting her medication. Patient agreeable to this plan. BMP today shows normal kidney function.  Plan: -Continue olmesartan 40 mg daily -Provided BP log to check BP daily for 2 weeks -Follow-up in 2 weeks for BP check

## 2022-05-02 NOTE — Assessment & Plan Note (Addendum)
Last A1c 7.9% 3 months ago. A1c today unchanged at 7.9%. During last office visit, patient was not interested in starting Ozempic because she wanted to try to lose the weight on her own. Her weight is now up 2 lbs the last 3 months. She has not tolerated metformin in the past. She does not like the idea of injecting herself every week but willing to try Ozempic now to see if she tolerates it.  Also counseled patient on dietary changes and advised her to reduce her intake of processed foods, sugary foods and foods high in carbs. Patient also agreeable to referral to the supervised exercise program. MA/CR slightly elevated to 39.  Plan: -Continue Farxiga 10 mg daily -Start Ozempic 0.25 mg weekly injections for 4 weeks then 0.5 mg weekly -Referral to [place] -Follow-up in 2 weeks -Repeat A1c in 3 months

## 2022-05-02 NOTE — Assessment & Plan Note (Addendum)
Lipid panel today shows total cholesterol 221, triglyceride 119, HDL 39 and LDL 160. In the setting of patient's diabetes, moderate to high intensity statin is recommended. Discussed with patient who informed me that she is not ready to start a new medication at this point but she will work on her diet as well as losing weight to help improve her cholesterol.  Plan: -Counseled on lifestyle modifications with dietary changes such as reducing intake of red meat, processed and fatty foods. -Referred to the P.R.E.P classes -Repeat lipid panel in 6 months

## 2022-05-02 NOTE — Assessment & Plan Note (Signed)
Multinodular thyroid Patient's thyroid ultrasound still pending. Discussed with patient about calling the imaging center to see if she can complete this at the same time as her mammogram next month. She denies any palpitations, fatigue, anxiety, tremors or unintentional weight loss. TSH today is within normal limits. -Pending thyroid ultrasound

## 2022-05-02 NOTE — Telephone Encounter (Signed)
Returned my call, explained PREP would like to attend, but [place] so would need late afternoon/evening class; will ask Pam RN [facility] to contact her with Gaspar Bidding class schedule.

## 2022-05-02 NOTE — Assessment & Plan Note (Signed)
Patient states her husband will more than 10 years suddenly told her he wanted a divorce 2 months ago around the same time that her mother passed away. She does not have any vaginal or urinary symptoms but she wants to get tested for STDs to make sure he did not give her anything.  STI screening negative for HIV, GC/chlamydia and trichomonas.  -Patient called and informed of the results

## 2022-05-07 NOTE — Progress Notes (Signed)
Internal Medicine Clinic Attending  Case discussed with the resident at the time of the visit.  We reviewed the resident's history and exam and pertinent patient test results.  I agree with the assessment, diagnosis, and plan of care documented in the resident's note.  

## 2022-05-14 ENCOUNTER — Other Ambulatory Visit: Payer: Self-pay

## 2022-05-14 ENCOUNTER — Encounter: Payer: Self-pay | Admitting: Student

## 2022-05-14 ENCOUNTER — Ambulatory Visit: Payer: 59 | Admitting: Student

## 2022-05-14 ENCOUNTER — Other Ambulatory Visit (HOSPITAL_COMMUNITY): Payer: Self-pay

## 2022-05-14 VITALS — BP 118/71 | HR 84 | Temp 98.2°F | Ht 68.0 in | Wt 234.3 lb

## 2022-05-14 DIAGNOSIS — E119 Type 2 diabetes mellitus without complications: Secondary | ICD-10-CM | POA: Diagnosis not present

## 2022-05-14 DIAGNOSIS — E669 Obesity, unspecified: Secondary | ICD-10-CM | POA: Insufficient documentation

## 2022-05-14 DIAGNOSIS — I10 Essential (primary) hypertension: Secondary | ICD-10-CM

## 2022-05-14 DIAGNOSIS — Z6835 Body mass index (BMI) 35.0-35.9, adult: Secondary | ICD-10-CM

## 2022-05-14 DIAGNOSIS — E66812 Obesity, class 2: Secondary | ICD-10-CM

## 2022-05-14 DIAGNOSIS — Z7985 Long-term (current) use of injectable non-insulin antidiabetic drugs: Secondary | ICD-10-CM

## 2022-05-14 MED ORDER — OLMESARTAN MEDOXOMIL 40 MG PO TABS
40.0000 mg | ORAL_TABLET | Freq: Every day | ORAL | 3 refills | Status: DC
  Filled 2022-05-14 – 2022-05-22 (×2): qty 90, 90d supply, fill #0

## 2022-05-14 MED ORDER — DAPAGLIFLOZIN PROPANEDIOL 10 MG PO TABS
10.0000 mg | ORAL_TABLET | Freq: Every day | ORAL | 3 refills | Status: DC
  Filled 2022-05-14 – 2022-05-23 (×3): qty 90, 90d supply, fill #0

## 2022-05-14 NOTE — Assessment & Plan Note (Signed)
Body mass index is 35.63 kg/m. Patient has lost 7 pounds since last office visit 2 weeks ago after initiating Ozempic. She has not had any adverse effects from the Ozempic. Has been making changes to her diet. She is looking forward to working with the supervised exercise program however they called her once and she has not heard back from them since then. Filed Weights   05/14/22 1315  Weight: 234 lb 4.8 oz (106.3 kg)   Plan: -Continue Ozempic 0.25 mg weekly for 2 additional weeks and escalate to 0.5 mg weekly -Continue on lifestyle modifications with dietary changes and exercise. -Follow-up with PREP

## 2022-05-14 NOTE — Patient Instructions (Signed)
Thank you, Ms.Shanty E Spranger for allowing [place] to provide your care today. Today we discussed your blood pressure, cholesterol, diabetes and weight loss.  Congratulations, you have lost 7 pounds since your last office visit 2 weeks ago. Continue making changes in your diet and exercise at least 5 days a week to help with weight loss and improve your diabetes, blood pressure and cholesterol.  Have sent refills of your Farxiga and olmesartan  My Chart Access: [email or web address]  Please follow-up in 3-4 months  Please make sure to arrive 15 minutes prior to your next appointment. If you arrive late, you may be asked to reschedule.    We look forward to seeing you next time. Please call our clinic at [phone] if you have any questions or concerns. The best time to call is Monday-Friday from 9am-4pm, but there is someone available 24/7. If after hours or the weekend, call the main hospital number and ask for the Internal Medicine Resident On-Call. If you need medication refills, please notify your pharmacy one week in advance and they will send [place] a request.   Thank you for letting us take part in your care. Wishing you the best!  Lacinda Axon, MD 05/14/2022, 1:42 PM IM Resident, PGY-3 [place]aldo Milian 41:10

## 2022-05-14 NOTE — Assessment & Plan Note (Signed)
Patient here for 2-week follow-up on her blood pressure. BP elevated to the 140s to 150s during last office visit however patient stated that her blood pressure is usually normal at home. Provided patient BP log to monitor BP at home and follow-up today. BP log shows SBP in the 120s to 130s. BP in clinic today is normal with SBP in the 110s. She reports she has been less stressed after her negative STI screenings.  She denies any headaches, blurry vision or dizziness. Vitals:   05/14/22 1315  BP: 118/71   Plan: -Refill olmesartan 40 mg daily -Follow-up in 3 months for repeat BP and BMP

## 2022-05-14 NOTE — Progress Notes (Signed)
   CC: Hypertension follow-up  HPI:  Ms.Rubbie E Rochin is a 52 y.o. female with PMH as below who presents to clinic to follow-up on her blood pressure. Please see problem based charting for evaluation, assessment and plan.  Past Medical History:  Diagnosis Date   Diabetes ([place])    Hypertension     Review of Systems:  Constitutional: Negative for fever or fatigue Eyes: Negative for visual changes Respiratory: Negative for shortness of breath Cardiac: Negative for chest pain or palpitations Neuro: Negative for headache or weakness  Physical Exam: General: Pleasant, well-appearing middle-aged woman. No acute distress. Neck: Mild thyromegaly.  No lymphadenopathy. Normal ROM Cardiac: RRR. No murmurs, rubs or gallops. No LE edema Respiratory: Lungs CTAB. No wheezing or crackles. Abdominal: Soft, symmetric and non tender. Normal BS. Skin: Warm, dry and intact without rashes or lesions Neuro: A&O x 3. Moves all extremities.  Normal sensation to gross touch. Psych: Appropriate mood and affect.  Vitals:   05/14/22 1315  BP: 118/71  Pulse: 84  Temp: 98.2 F (36.8 C)  TempSrc: Oral  SpO2: 100%  Weight: 234 lb 4.8 oz (106.3 kg)  Height: 5\' 8"  (1.727 m)    Assessment & Plan:   Essential hypertension Patient here for 2-week follow-up on her blood pressure. BP elevated to the 140s to 150s during last office visit however patient stated that her blood pressure is usually normal at home. Provided patient BP log to monitor BP at home and follow-up today. BP log shows SBP in the 120s to 130s. BP in clinic today is normal with SBP in the 110s. She reports she has been less stressed after her negative STI screenings.  She denies any headaches, blurry vision or dizziness. Vitals:   05/14/22 1315  BP: 118/71   Plan: -Refill olmesartan 40 mg daily -Follow-up in 3 months for repeat BP and BMP  Obesity, Class II, BMI 35-39.9 Body mass index is 35.63 kg/m. Patient has lost 7 pounds  since last office visit 2 weeks ago after initiating Ozempic. She has not had any adverse effects from the Ozempic. Has been making changes to her diet. She is looking forward to working with the supervised exercise program however they called her once and she has not heard back from them since then. Filed Weights   05/14/22 1315  Weight: 234 lb 4.8 oz (106.3 kg)   Plan: -Continue Ozempic 0.25 mg weekly for 2 additional weeks and escalate to 0.5 mg weekly -Continue on lifestyle modifications with dietary changes and exercise. -Follow-up with PREP   See Encounters Tab for problem based charting.  Patient discussed with Dr.  Louann Liv, MD, MPH

## 2022-05-16 ENCOUNTER — Telehealth: Payer: Self-pay

## 2022-05-16 NOTE — Telephone Encounter (Signed)
Call to pt reference next PREP class in the evening. Will start 06/03/22 T/TH 6p-715p x 12 wks Will call her within the next week to schedule her intake appt.

## 2022-05-21 NOTE — Progress Notes (Signed)
Internal Medicine Clinic Attending  Case discussed with Dr. Amponsah  At the time of the visit.  We reviewed the resident's history and exam and pertinent patient test results.  I agree with the assessment, diagnosis, and plan of care documented in the resident's note.  

## 2022-05-22 ENCOUNTER — Other Ambulatory Visit (HOSPITAL_COMMUNITY): Payer: Self-pay

## 2022-05-22 ENCOUNTER — Other Ambulatory Visit: Payer: Self-pay | Admitting: Internal Medicine

## 2022-05-22 ENCOUNTER — Telehealth: Payer: Self-pay

## 2022-05-22 MED ORDER — CONTOUR NEXT TEST VI STRP
ORAL_STRIP | Freq: Two times a day (BID) | 5 refills | Status: DC
  Filled 2022-05-22: qty 50, 25d supply, fill #0

## 2022-05-22 NOTE — Telephone Encounter (Signed)
Decision:Approved 05/22/2022-05/23/2023

## 2022-05-22 NOTE — Telephone Encounter (Signed)
Prior Authorization for patient Alyssa Buck) came through on cover my meds was submitted with last office notes awaiting approval or denial

## 2022-05-23 ENCOUNTER — Other Ambulatory Visit: Payer: Self-pay

## 2022-05-23 ENCOUNTER — Other Ambulatory Visit (HOSPITAL_COMMUNITY): Payer: Self-pay

## 2022-05-23 ENCOUNTER — Telehealth: Payer: Self-pay

## 2022-05-23 NOTE — Telephone Encounter (Signed)
Call to pt reference next PREP class starting on 06/03/22 T/TH 6p-715p. Can do start then.  Intake scheduled for 05/29/22 at 5pm.  Will meet in lobby of [facility].

## 2022-05-26 ENCOUNTER — Ambulatory Visit
Admission: RE | Admit: 2022-05-26 | Discharge: 2022-05-26 | Disposition: A | Discharge status: Home / Self Care | Payer: 59 | Source: Ambulatory Visit | Attending: Student in an Organized Health Care Education/Training Program | Admitting: Student in an Organized Health Care Education/Training Program

## 2022-05-26 ENCOUNTER — Ambulatory Visit
Admission: RE | Admit: 2022-05-26 | Discharge: 2022-05-26 | Disposition: A | Discharge status: Home / Self Care | Payer: 59 | Source: Ambulatory Visit | Attending: Internal Medicine | Admitting: Internal Medicine

## 2022-05-26 DIAGNOSIS — I10 Essential (primary) hypertension: Secondary | ICD-10-CM

## 2022-05-26 DIAGNOSIS — Z1231 Encounter for screening mammogram for malignant neoplasm of breast: Secondary | ICD-10-CM

## 2022-05-27 ENCOUNTER — Other Ambulatory Visit: Payer: Self-pay | Admitting: Student

## 2022-05-27 DIAGNOSIS — E042 Nontoxic multinodular goiter: Secondary | ICD-10-CM

## 2022-05-29 ENCOUNTER — Other Ambulatory Visit: Payer: Self-pay | Admitting: Student in an Organized Health Care Education/Training Program

## 2022-05-29 DIAGNOSIS — R928 Other abnormal and inconclusive findings on diagnostic imaging of breast: Secondary | ICD-10-CM

## 2022-06-02 ENCOUNTER — Other Ambulatory Visit: Payer: Self-pay | Admitting: Internal Medicine

## 2022-06-02 DIAGNOSIS — E042 Nontoxic multinodular goiter: Secondary | ICD-10-CM

## 2022-06-11 ENCOUNTER — Other Ambulatory Visit (HOSPITAL_COMMUNITY)
Admission: RE | Admit: 2022-06-11 | Discharge: 2022-06-11 | Disposition: A | Discharge status: Home / Self Care | Payer: 59 | Source: Ambulatory Visit | Attending: Internal Medicine | Admitting: Internal Medicine

## 2022-06-11 ENCOUNTER — Ambulatory Visit
Admission: RE | Admit: 2022-06-11 | Discharge: 2022-06-11 | Disposition: A | Discharge status: Home / Self Care | Payer: 59 | Source: Ambulatory Visit | Attending: Internal Medicine | Admitting: Internal Medicine

## 2022-06-11 DIAGNOSIS — E042 Nontoxic multinodular goiter: Secondary | ICD-10-CM | POA: Insufficient documentation

## 2022-06-11 NOTE — Progress Notes (Signed)
[facility] PREP Weekly Session  Patient Details  Name: Alyssa Buck MRN: [id number] Date of Birth: June 02, 1970 Age: 52 y.o. PCP: Leigh Aurora, DO  There were no vitals filed for this visit.   [facility] Weekly seesion - 06/11/22 0900       [facility] "PREP" Location   [facility] "PREP" Location [facility]      Weekly Session   Topic Discussed Importance of resistance training;Other ways to be active   fit testing   Minutes exercised this week 45 minutes    Classes attended to date [place] 06/11/2022, 9:33 AM

## 2022-06-12 ENCOUNTER — Other Ambulatory Visit: Payer: 59

## 2022-06-12 ENCOUNTER — Ambulatory Visit
Admission: RE | Admit: 2022-06-12 | Discharge: 2022-06-12 | Disposition: A | Discharge status: Home / Self Care | Payer: 59 | Source: Ambulatory Visit | Attending: Student in an Organized Health Care Education/Training Program | Admitting: Student in an Organized Health Care Education/Training Program

## 2022-06-12 ENCOUNTER — Other Ambulatory Visit (HOSPITAL_COMMUNITY): Payer: Self-pay

## 2022-06-12 ENCOUNTER — Other Ambulatory Visit: Payer: Self-pay | Admitting: Student in an Organized Health Care Education/Training Program

## 2022-06-12 DIAGNOSIS — R928 Other abnormal and inconclusive findings on diagnostic imaging of breast: Secondary | ICD-10-CM

## 2022-06-13 ENCOUNTER — Other Ambulatory Visit (HOSPITAL_COMMUNITY): Payer: Self-pay

## 2022-06-13 ENCOUNTER — Other Ambulatory Visit: Payer: Self-pay | Admitting: Student in an Organized Health Care Education/Training Program

## 2022-06-13 DIAGNOSIS — R921 Mammographic calcification found on diagnostic imaging of breast: Secondary | ICD-10-CM

## 2022-06-13 DIAGNOSIS — N6489 Other specified disorders of breast: Secondary | ICD-10-CM

## 2022-06-13 LAB — CYTOLOGY - NON PAP

## 2022-06-16 ENCOUNTER — Telehealth: Payer: Self-pay

## 2022-06-16 NOTE — Telephone Encounter (Signed)
Prior Authorization for patient (ozempic) came through on cover my meds was submitted with last office notes and labs awaiting approval or denial

## 2022-06-16 NOTE — Telephone Encounter (Addendum)
Decision:Approved Alyssa Buck (Key: [id number]) Rx #: [id number] Ozempic (0.25 or 0.5 MG/DOSE) '2MG'$ Alyssa Buck pen-injectors Form Charity fundraiser PA Form (2017 NCP[employer]) Created Message from Plan Your PA request has been approved. Additional information will be provided in the approval communication. (Message 1145). Authorization Expiration Date: June 15, 2025.

## 2022-06-18 ENCOUNTER — Telehealth: Payer: Self-pay | Admitting: Student

## 2022-06-18 NOTE — Telephone Encounter (Signed)
Called patient regarding thyroid biopsy results.  Patient was able to pick up the phone, and identifiers with date of birth and name.  I spoke with the patient, letting her know that she had biopsy results that were benign.  That she will need an ultrasound in about a year to follow-up these nodules.  Patient understood the results and the plan moving forward.  Did instruct patient to call or message me if she had any other questions.

## 2022-06-26 NOTE — Progress Notes (Signed)
[facility] PREP Weekly Session  Patient Details  Name: Alyssa Buck MRN: [id number] Date of Birth: 07/25/1970 Age: 52 y.o. PCP: Leigh Aurora, DO  Vitals:   06/24/22 1800  Weight: 236 lb 9.6 oz (107.3 kg)     [facility] Weekly seesion - 06/26/22 1600       [facility] "PREP" Location   [facility] "PREP" Location [facility]      Weekly Session   Topic Discussed Health habits    Minutes exercised this week 150 minutes    Classes attended to date [place] 06/26/2022, 4:59 PM

## 2022-06-27 ENCOUNTER — Other Ambulatory Visit (HOSPITAL_COMMUNITY): Payer: Self-pay

## 2022-07-03 NOTE — Progress Notes (Signed)
[facility] PREP Weekly Session  Patient Details  Name: Alyssa Buck MRN: [id number] Date of Birth: Jul 11, 1970 Age: 52 y.o. PCP: Leigh Aurora, DO  Vitals:   07/01/22 1800  Weight: 238 lb (108 kg)     [facility] Weekly seesion - 07/03/22 1600       [facility] "PREP" Location   [facility] "PREP" [facility]      Weekly Session   Topic Discussed Restaurant Eating   Salt and sugar demos   Minutes exercised this week 130 minutes    Classes attended to date [place] 07/03/2022, 4:50 PM

## 2022-07-04 ENCOUNTER — Ambulatory Visit
Admission: RE | Admit: 2022-07-04 | Discharge: 2022-07-04 | Disposition: A | Discharge status: Home / Self Care | Payer: 59 | Source: Ambulatory Visit | Attending: Student in an Organized Health Care Education/Training Program | Admitting: Student in an Organized Health Care Education/Training Program

## 2022-07-04 DIAGNOSIS — R921 Mammographic calcification found on diagnostic imaging of breast: Secondary | ICD-10-CM

## 2022-07-04 DIAGNOSIS — N6489 Other specified disorders of breast: Secondary | ICD-10-CM

## 2022-07-04 HISTORY — PX: BREAST BIOPSY: SHX20

## 2022-07-09 NOTE — Progress Notes (Signed)
[facility] PREP Weekly Session  Patient Details  Name: Alyssa Buck MRN: [id number] Date of Birth: May 21, 1970 Age: 52 y.o. PCP: Leigh Aurora, DO  Vitals:   07/08/22 1800  Weight: 233 lb (105.7 kg)     [facility] Weekly seesion - 07/09/22 1000       [facility] "PREP" Location   [facility] "PREP" Location [facility]      Weekly Session   Topic Discussed Stress management and problem solving   meditation   Minutes exercised this week 100 minutes    Classes attended to date [place] 07/09/2022, 10:04 AM

## 2022-07-24 NOTE — Progress Notes (Signed)
[facility] PREP Weekly Session  Patient Details  Name: Alyssa Buck MRN: [id number] Date of Birth: 08-Mar-1971 Age: 52 y.o. PCP: Modena Slater, DO  Vitals:   07/22/22 1800  Weight: 234 lb 3.2 oz (106.2 kg)     [facility] Weekly seesion - 07/24/22 1500       [facility] "PREP" Location   [facility] "PREP" Location [facility]      Weekly Session   Topic Discussed Expectations and non-scale victories    Minutes exercised this week 180 minutes    Classes attended to date 10             Bonnye Fava 07/24/2022, 3:49 PM

## 2022-07-31 NOTE — Progress Notes (Signed)
[facility] PREP Weekly Session  Patient Details  Name: Alyssa Buck MRN: [id number] Date of Birth: [birth year] Age: 52 y.o. PCP: Modena Slater, DO  Vitals:   07/29/22 1800  Weight: 234 lb (106.1 kg)     [facility] Weekly seesion - 07/31/22 1600       [facility] "PREP" Location   [facility] "PREP" Location [facility]      Weekly Session   Topic Discussed --   portions   Minutes exercised this week 140 minutes    Classes attended to date 12             Bonnye Fava 07/31/2022, 4:26 PM

## 2022-08-06 NOTE — Progress Notes (Signed)
[facility] PREP Weekly Session  Patient Details  Name: Alyssa Buck MRN: [id number] Date of Birth: 05-19-1970 Age: 52 y.o. PCP: Modena Slater, DO  Vitals:   08/05/22 1800  Weight: 230 lb (104.3 kg)     [facility] Weekly seesion - 08/06/22 1500       [facility] "PREP" Location   [facility] "PREP" Location [facility]      Weekly Session   Topic Discussed Finding support    Minutes exercised this week 130 minutes    Classes attended to date 56             Pam Jerral Bonito 08/06/2022, 3:50 PM

## 2022-08-11 ENCOUNTER — Other Ambulatory Visit (HOSPITAL_COMMUNITY): Payer: Self-pay

## 2022-08-11 ENCOUNTER — Ambulatory Visit: Payer: 59 | Admitting: Student

## 2022-08-11 VITALS — BP 119/81 | HR 78 | Temp 97.5°F | Ht 68.0 in | Wt 232.4 lb

## 2022-08-11 DIAGNOSIS — Z6835 Body mass index (BMI) 35.0-35.9, adult: Secondary | ICD-10-CM

## 2022-08-11 DIAGNOSIS — E1165 Type 2 diabetes mellitus with hyperglycemia: Secondary | ICD-10-CM

## 2022-08-11 DIAGNOSIS — E042 Nontoxic multinodular goiter: Secondary | ICD-10-CM | POA: Diagnosis not present

## 2022-08-11 DIAGNOSIS — Z7985 Long-term (current) use of injectable non-insulin antidiabetic drugs: Secondary | ICD-10-CM

## 2022-08-11 DIAGNOSIS — E669 Obesity, unspecified: Secondary | ICD-10-CM

## 2022-08-11 DIAGNOSIS — Z7984 Long term (current) use of oral hypoglycemic drugs: Secondary | ICD-10-CM

## 2022-08-11 DIAGNOSIS — I10 Essential (primary) hypertension: Secondary | ICD-10-CM | POA: Diagnosis not present

## 2022-08-11 DIAGNOSIS — E119 Type 2 diabetes mellitus without complications: Secondary | ICD-10-CM

## 2022-08-11 LAB — POCT GLYCOSYLATED HEMOGLOBIN (HGB A1C): Hemoglobin A1C: 6 % — AB (ref 4.0–5.6)

## 2022-08-11 LAB — GLUCOSE, CAPILLARY: Glucose-Capillary: 96 mg/dL (ref 70–99)

## 2022-08-11 MED ORDER — OLMESARTAN MEDOXOMIL 40 MG PO TABS
40.0000 mg | ORAL_TABLET | Freq: Every day | ORAL | 3 refills | Status: DC
  Filled 2022-08-11: qty 90, 90d supply, fill #0
  Filled 2022-11-21: qty 90, 90d supply, fill #1
  Filled 2023-02-14: qty 90, 90d supply, fill #2

## 2022-08-11 MED ORDER — SEMAGLUTIDE(0.25 OR 0.5MG/DOS) 2 MG/3ML ~~LOC~~ SOPN
0.5000 mg | PEN_INJECTOR | SUBCUTANEOUS | 2 refills | Status: DC
  Filled 2022-08-11: qty 6, 56d supply, fill #0
  Filled 2022-10-23 – 2023-02-14 (×2): qty 6, 56d supply, fill #1

## 2022-08-11 MED ORDER — DAPAGLIFLOZIN PROPANEDIOL 10 MG PO TABS
10.0000 mg | ORAL_TABLET | Freq: Every day | ORAL | 3 refills | Status: DC
  Filled 2022-08-11: qty 90, 90d supply, fill #0
  Filled 2022-11-21: qty 90, 90d supply, fill #1
  Filled 2023-02-14: qty 90, 90d supply, fill #2

## 2022-08-11 NOTE — Patient Instructions (Addendum)
Thank you, Ms.Asenath Dorann Ou for allowing [place] to provide your care today. Today we discussed your diabetes, hypertension, cholesterol, thyroid nodule and weight.   Your A1c has improved to 6.0%. Congratulations on working hard to get this down.  I have ordered the following medication/changed the following medications:  Resume Ozempic 0.5 mg weekly  My Chart Access: [email or web address]  Please follow-up in 3 months  Please make sure to arrive 15 minutes prior to your next appointment. If you arrive late, you may be asked to reschedule.    We look forward to seeing you next time. Please call our clinic at [phone] if you have any questions or concerns. The best time to call is Monday-Friday from 9am-4pm, but there is someone available 24/7. If after hours or the weekend, call the main hospital number and ask for the Internal Medicine Resident On-Call. If you need medication refills, please notify your pharmacy one week in advance and they will send [place] a request.   Thank you for letting us take part in your care. Wishing you the best!  Steffanie Rainwater, MD 08/11/2022, 1:51 PM IM Resident, PGY-3 [place] 41:10

## 2022-08-11 NOTE — Progress Notes (Unsigned)
CC: Follow-up  HPI:  Alyssa Buck is a 52 y.o. female with PMH as below presents to clinic to follow-up on her chronic medical problems. Please see problem based charting for evaluation, assessment and plan.  Past Medical History:  Diagnosis Date   Diabetes (HCC)    Hypertension     Review of Systems:  Constitutional: Negative for fever or fatigue Eyes: Negative for visual changes Respiratory: Negative for shortness of breath Cardiac: Negative for chest pain MSK: Negative for back pain Abdomen: Negative for abdominal pain, constipation or diarrhea Neuro: Negative for headache or weakness  Physical Exam: General: Pleasant, well-appearing obese middle-aged woman. No acute distress. Neck: Mild thyromegaly.  Normal ROM.  Supple Cardiac: RRR. No murmurs, rubs or gallops. No LE edema Respiratory: Lungs CTAB. No wheezing or crackles. Abdominal: Soft, symmetric and non tender. Normal BS. Skin: Warm, dry and intact without rashes or lesions Extremities: Atraumatic. Full ROM.  Radial and DP pulses 2+ and symmetric. Neuro: A&O x 3. Moves all extremities.  Normal sensation to gross touch. Psych: Appropriate mood and affect.  Vitals:   08/11/22 1320 08/11/22 1357  BP: 138/83 119/81  Pulse: 73 78  Temp: (!) 97.5 F (36.4 C)   TempSrc: Oral   SpO2: 100%   Weight: 232 lb 6.4 oz (105.4 kg)   Height: 5\' 8"  (1.727 m)          Assessment & Plan:   Type 2 diabetes mellitus with hyperglycemia (HCC) Alyssa Buck is following up on her diabetes today. I referred her to the prep class III months ago she has really enjoyed this. She is getting close to finishing the 12 sessions and currently feels empowered and aware of the exercises she needs to do. she has also made changes to her diet and cut out sugary drinks.  Her weight is down 2 lbs since her last office visit. A1c has improved from 7.9% 3 months ago to 6.0% today. I congratulated her on working hard to get her A1c down.  She is very happy about the improvement in her A1c and very motivated to continue exercising and eating better. She reports running out of her Ozempic 2 weeks ago.  She is hoping to get off Ozempic at some point but willing to continue for now to help with some weight loss.  Plan: -Refill Ozempic 0.5 mg weekly -Refill Farxiga 10 mg daily -Continue on PREP classes -Referral to ophthalmology for diabetic eye exam -Follow-up in 3 months for repeat A1c  Essential hypertension Her blood pressure is very well-controlled. SBP initially in the 130s but repeat improved to the 110s. She tells me that she checks her blood pressure at home and they are usually normal with SBP in the 110s to 120s. -Refill olmesartan 40 mg daily -Repeat BP and BMP at next office visit  Multinodular thyroid Patient had a FNA of a thyroid nodule 2 months ago and it showed a benign follicular nodule (Bethesda category 2). She remains asymptomatic.  -Repeat thyroid ultrasound in 12 months (around February 2025)  Obesity, Class II, BMI 35-39.9 Patient has been working with the supervised exercise program at the [facility]. She has really enjoyed it and found it very helpful. She tells me she has learned a lot and plans to continue working out even after the program ends. Her weight is only down 2 lbs since her last office visit but down 9 lbs since January 17th.  Body mass index is 35.34 kg/m. Filed Weights  08/11/22 1320  Weight: 232 lb 6.4 oz (105.4 kg)  -Continue PREP classes -Continue lifestyle modifications with dietary changes and exercise    See Encounters Tab for problem based charting.  Patient discussed with Dr.  Avis Epley, MD, MPH

## 2022-08-12 ENCOUNTER — Encounter: Payer: Self-pay | Admitting: Student

## 2022-08-12 NOTE — Assessment & Plan Note (Signed)
Patient has been working with the supervised exercise program at the [facility]. She has really enjoyed it and found it very helpful. She tells me she has learned a lot and plans to continue working out even after the program ends. Her weight is only down 2 lbs since her last office visit but down 9 lbs since January 17th.  Body mass index is 35.34 kg/m. Filed Weights   08/11/22 1320  Weight: 232 lb 6.4 oz (105.4 kg)  -Continue PREP classes -Continue lifestyle modifications with dietary changes and exercise

## 2022-08-12 NOTE — Progress Notes (Signed)
Internal Medicine Clinic Attending  Case discussed with Dr. Amponsah  At the time of the visit.  We reviewed the resident's history and exam and pertinent patient test results.  I agree with the assessment, diagnosis, and plan of care documented in the resident's note.  

## 2022-08-12 NOTE — Assessment & Plan Note (Signed)
Her blood pressure is very well-controlled. SBP initially in the 130s but repeat improved to the 110s. She tells me that she checks her blood pressure at home and they are usually normal with SBP in the 110s to 120s. -Refill olmesartan 40 mg daily -Repeat BP and BMP at next office visit

## 2022-08-12 NOTE — Assessment & Plan Note (Signed)
Patient had a FNA of a thyroid nodule 2 months ago and it showed a benign follicular nodule (Bethesda category 2). She remains asymptomatic.  -Repeat thyroid ultrasound in 12 months (around February 2025)

## 2022-08-12 NOTE — Assessment & Plan Note (Addendum)
Alyssa Buck is following up on her diabetes today. I referred her to the prep class III months ago she has really enjoyed this. She is getting close to finishing the 12 sessions and currently feels empowered and aware of the exercises she needs to do. she has also made changes to her diet and cut out sugary drinks.  Her weight is down 2 lbs since her last office visit. A1c has improved from 7.9% 3 months ago to 6.0% today. I congratulated her on working hard to get her A1c down. She is very happy about the improvement in her A1c and very motivated to continue exercising and eating better. She reports running out of her Ozempic 2 weeks ago.  She is hoping to get off Ozempic at some point but willing to continue for now to help with some weight loss.  Plan: -Refill Ozempic 0.5 mg weekly -Refill Farxiga 10 mg daily -Continue on PREP classes -Referral to ophthalmology for diabetic eye exam -Follow-up in 3 months for repeat A1c

## 2022-08-14 NOTE — Progress Notes (Signed)
[facility] PREP Weekly Session  Patient Details  Name: Alyssa Buck MRN: [id number] Date of Birth: November 03, 1970 Age: 52 y.o. PCP: Modena Slater, DO  Vitals:   08/12/22 1800  Weight: 233 lb (105.7 kg)     [facility] Weekly seesion - 08/14/22 1100       [facility] "PREP" Location   [facility] "PREP" Location [facility]      Weekly Session   Topic Discussed Calorie breakdown    Minutes exercised this week --   not reported   Classes attended to date 107             Pam Jerral Bonito 08/14/2022, 11:14 AM

## 2022-08-21 NOTE — Progress Notes (Signed)
[facility] PREP Weekly Session  Patient Details  Name: Alyssa Buck MRN: [id number] Date of Birth: April 05, 1971 Age: 51 y.o. PCP: Modena Slater, DO  Vitals:   08/19/22 1800  Weight: 231 lb (104.8 kg)     [facility] Weekly seesion - 08/21/22 1100       [facility] "PREP" Location   [facility] "PREP" [facility]      Weekly Session   Topic Discussed --   goal setting   Minutes exercised this week 155 minutes    Classes attended to date 26             Pam Jerral Bonito 08/21/2022, 11:56 AM

## 2022-09-04 NOTE — Progress Notes (Signed)
[facility] PREP Evaluation  Patient Details  Name: Alyssa Buck MRN: [id number] Date of Birth: 1971-02-17 Age: 52 y.o. PCP: Modena Slater, DO  Vitals:   09/04/22 1745  BP: 138/86  Pulse: 81  SpO2: 99%  Weight: 236 lb 12.8 oz (107.4 kg)     [facility] Eval - 09/04/22 1700       [facility] "PREP" Location   [facility] "PREP" Location [facility]      Referral    Referring Provider Hoffman    Program Start Date 06/03/22    Program End Date 09/02/22      Measurement   Waist Circumference 44 inches    Waist Circumference End Program 43 inches    Hip Circumference 49 inches    Hip Circumference End Program 47 inches    Body fat 42 percent      Information for Trainer   Goals Reset      Mobility and Daily Activities   I find it easy to walk up or down two or more flights of stairs. 2    I have no trouble taking out the trash. 4    I do housework such as vacuuming and dusting on my own without difficulty. 4    I can easily lift a gallon of milk (8lbs). 4    I can easily walk a mile. 4    I have no trouble reaching into high cupboards or reaching down to pick up something from the floor. 4    I do not have trouble doing out-door work such as Loss adjuster, chartered, raking leaves, or gardening. 4      Mobility and Daily Activities   I feel younger than my age. 3    I feel independent. 4    I feel energetic. 3    I live an active life.  4    I feel strong. 4    I feel healthy. 3    I feel active as other people my age. 4      How fit and strong are you.   Fit and Strong Total Score 51            Past Medical History:  Diagnosis Date   Diabetes (HCC)    Hypertension    Past Surgical History:  Procedure Laterality Date   BREAST BIOPSY Left 07/04/2022   MM LT BREAST BX W LOC DEV 1ST LESION IMAGE BX SPEC STEREO GUIDE 07/04/2022 [facility]   BREAST BIOPSY Right 07/04/2022   MM RT BREAST BX W LOC DEV 1ST LESION IMAGE BX SPEC STEREO GUIDE 07/04/2022 [facility]   BREAST  REDUCTION SURGERY     CESAREAN SECTION     1994, 1999, 2001   NOVASURE ABLATION  2005   abnormal uterine bleeding   REDUCTION MAMMAPLASTY Bilateral 1994   Social History   Tobacco Use  Smoking Status Never  Smokeless Tobacco Never  Completed >18 workouts, 10 educational sessions Fit testing Cardio march: 234 to 265 Sit to stand: 11 to 12 Bicep curl: 21 to 26 Encouraged continued exercise including strength, cardio and balance exercises   Bonnye Fava 09/04/2022, 5:48 PM

## 2022-10-23 ENCOUNTER — Other Ambulatory Visit (HOSPITAL_COMMUNITY): Payer: Self-pay

## 2022-11-06 ENCOUNTER — Other Ambulatory Visit (HOSPITAL_COMMUNITY): Payer: Self-pay

## 2022-11-21 ENCOUNTER — Other Ambulatory Visit (HOSPITAL_COMMUNITY): Payer: Self-pay

## 2023-02-16 ENCOUNTER — Other Ambulatory Visit: Payer: Self-pay

## 2023-02-18 ENCOUNTER — Other Ambulatory Visit (HOSPITAL_COMMUNITY): Payer: Self-pay

## 2023-02-26 ENCOUNTER — Other Ambulatory Visit (HOSPITAL_COMMUNITY): Payer: Self-pay

## 2023-05-14 ENCOUNTER — Ambulatory Visit: Payer: Managed Care, Other (non HMO) | Admitting: Student

## 2023-05-14 ENCOUNTER — Other Ambulatory Visit: Payer: Self-pay | Admitting: Student

## 2023-05-14 ENCOUNTER — Other Ambulatory Visit (HOSPITAL_COMMUNITY): Payer: Self-pay

## 2023-05-14 ENCOUNTER — Ambulatory Visit: Payer: Managed Care, Other (non HMO) | Admitting: Dietician

## 2023-05-14 VITALS — BP 150/91 | HR 83 | Wt 239.6 lb

## 2023-05-14 DIAGNOSIS — Z7985 Long-term (current) use of injectable non-insulin antidiabetic drugs: Secondary | ICD-10-CM

## 2023-05-14 DIAGNOSIS — E1165 Type 2 diabetes mellitus with hyperglycemia: Secondary | ICD-10-CM

## 2023-05-14 DIAGNOSIS — E119 Type 2 diabetes mellitus without complications: Secondary | ICD-10-CM

## 2023-05-14 DIAGNOSIS — E042 Nontoxic multinodular goiter: Secondary | ICD-10-CM | POA: Diagnosis not present

## 2023-05-14 DIAGNOSIS — R928 Other abnormal and inconclusive findings on diagnostic imaging of breast: Secondary | ICD-10-CM

## 2023-05-14 DIAGNOSIS — Z1211 Encounter for screening for malignant neoplasm of colon: Secondary | ICD-10-CM

## 2023-05-14 DIAGNOSIS — D241 Benign neoplasm of right breast: Secondary | ICD-10-CM

## 2023-05-14 DIAGNOSIS — I10 Essential (primary) hypertension: Secondary | ICD-10-CM | POA: Diagnosis not present

## 2023-05-14 DIAGNOSIS — H538 Other visual disturbances: Secondary | ICD-10-CM

## 2023-05-14 DIAGNOSIS — Z7984 Long term (current) use of oral hypoglycemic drugs: Secondary | ICD-10-CM

## 2023-05-14 DIAGNOSIS — K219 Gastro-esophageal reflux disease without esophagitis: Secondary | ICD-10-CM

## 2023-05-14 DIAGNOSIS — Z1231 Encounter for screening mammogram for malignant neoplasm of breast: Secondary | ICD-10-CM

## 2023-05-14 LAB — POCT GLYCOSYLATED HEMOGLOBIN (HGB A1C): Hemoglobin A1C: 10.6 % — AB (ref 4.0–5.6)

## 2023-05-14 LAB — GLUCOSE, CAPILLARY: Glucose-Capillary: 254 mg/dL — ABNORMAL HIGH (ref 70–99)

## 2023-05-14 MED ORDER — OLMESARTAN MEDOXOMIL 40 MG PO TABS
40.0000 mg | ORAL_TABLET | Freq: Every day | ORAL | 3 refills | Status: DC
  Filled 2023-05-14: qty 90, 90d supply, fill #0

## 2023-05-14 MED ORDER — ONETOUCH VERIO VI STRP
ORAL_STRIP | 5 refills | Status: AC
  Filled 2023-05-14: qty 150, 75d supply, fill #0

## 2023-05-14 MED ORDER — PANTOPRAZOLE SODIUM 40 MG PO TBEC
40.0000 mg | DELAYED_RELEASE_TABLET | Freq: Every day | ORAL | 1 refills | Status: DC
  Filled 2023-05-14: qty 90, 90d supply, fill #0

## 2023-05-14 MED ORDER — DAPAGLIFLOZIN PROPANEDIOL 10 MG PO TABS
10.0000 mg | ORAL_TABLET | Freq: Every day | ORAL | 3 refills | Status: DC
  Filled 2023-05-14 – 2023-05-18 (×2): qty 90, 90d supply, fill #0

## 2023-05-14 MED ORDER — SEMAGLUTIDE(0.25 OR 0.5MG/DOS) 2 MG/3ML ~~LOC~~ SOPN
0.2500 mg | PEN_INJECTOR | SUBCUTANEOUS | 1 refills | Status: DC
  Filled 2023-05-14 – 2023-05-18 (×2): qty 3, 28d supply, fill #0
  Filled 2023-07-20: qty 3, 56d supply, fill #1

## 2023-05-14 NOTE — Assessment & Plan Note (Signed)
Endorses acute onset of blurry vision in the right eye which started within the past days.  No pain of the right eye, no vision loss, no visual distortion.  States her vision is slightly blurry with some floaters.  She does follow with a retina specialist and has a known history of diabetic eye complications. Plan: Patient is scheduled to see her eye doctor on Monday, February 3. Discussed reasons to immediately seek additional medical attention such as sudden eye pain, vision loss, progressive blurriness

## 2023-05-14 NOTE — Progress Notes (Signed)
Subjective:  CC: Blurry vision, routine follow-up  HPI:  [name] is a 53 y.o. person with a past medical history stated below and presents today for the stated chief complaint. Please see problem based assessment and plan for additional details.  Past Medical History:  Diagnosis Date   Diabetes (HCC)    Hypertension     Current Outpatient Medications on File Prior to Visit  Medication Sig Dispense Refill   Microlet Lancets MISC Use to test blood sugar 2 (two) times daily. 100 each 4   No current facility-administered medications on file prior to visit.    Review of Systems: Please see assessment and plan for pertinent positives and negatives.  Objective:   Vitals:   05/14/23 1048  BP: (!) 150/91  Pulse: 83  Weight: 239 lb 9.6 oz (108.7 kg)    Physical Exam: Constitutional: Well-appearing Cardiovascular: Regular rate and rhythm Pulmonary/Chest: lungs clear to auscultation bilaterally Abdominal: soft, non-tender, non-distended Extremities: No edema of the lower extremities bilaterally Psych: Pleasant affect Thought process is linear and is goal-directed.     Assessment & Plan:  Essential hypertension Currently on olmesartan 40 mg.  Blood pressure today elevated at 150/91, patient states to take the blood pressure at home and has been in the normal range consistently.  May reflect whitecoat hypertension, will continue to evaluate further visits.  May consider additional agent if persistently elevated.  Appears to have had ankle swelling with amlodipine in the past.  Multinodular thyroid Thyroid nodule which was biopsied in the past, recommended to repeat thyroid ultrasonography around 67-month mark-which will be February 2025. Plan: Orders placed for thyroid ultrasound   Intraductal papilloma of breast, right Previous left and right breast biopsies demonstrating intraductal papilloma, recommended reimaging at 6 months and March 2024. Plan: Orders  placed for breast imaging   Blurry vision, right eye Endorses acute onset of blurry vision in the right eye which started within the past days.  No pain of the right eye, no vision loss, no visual distortion.  States her vision is slightly blurry with some floaters.  She does follow with a retina specialist and has a known history of diabetic eye complications. Plan: Patient is scheduled to see her eye doctor on Monday, February 3. Discussed reasons to immediately seek additional medical attention such as sudden eye pain, vision loss, progressive blurriness   Type 2 diabetes mellitus with hyperglycemia (HCC) Last visit in April 2024, A1c at that time was improved from 7.9 - 6.0.  Was on a regimen of Ozempic 0.5 mg weekly as well as Farxiga 10 mg daily.  Patient states that he stopped Ozempic a few months ago due to bloating and acid reflux.  She has been intolerant of metformin in the past due to GI upset and diarrhea.  She does endorse some ongoing diarrhea and constipation, but this appears to be unrelated to her Ozempic usage.  A1c today uncontrolled at 10.6.  This was discussed with the patient, we will attempt to optimize diet and medication regiment in hopes of avoiding initiation of insulin at this time.  Plan: Diabetic foot exam done today Microalbumin creatinine ratio collected Will follow with diabetic eye doctor Will initiate Ozempic 0.25 mg weekly Will initiate Protonix 40 mg daily to help mitigate side effects of Ozempic. Patient given same-day appointment Lupita Leash, met with her directly after our discussion. Discussion regarding nutrition and lifestyle modification at this visit as well. Lipid panel BMP to assess kidney function  Patient discussed with Dr. Julian Reil MD [facility]  PGY-1 Pager: [phone]  Phone: [phone] Date 05/14/2023  Time 9:10 PM

## 2023-05-14 NOTE — Progress Notes (Signed)
Diabetes Self-Management Education  Visit Type: Follow-up  Appt. Start Time: 11:45 am Appt. End Time: 12:15pm 05/14/2023  [name], identified by name and date of birth, is a 53 y.o. female with a diagnosis of Diabetes:  .    ASSESSMENT  Patient switched from rice to cauliflower rice , like broccoli, corn , zucchini , squash , onions Believes being less active was  her downfall Likes being active outside   Patient reports checking blood sugar in the morning before breakfast and before dinner  Socially reports going 1-year post divorce and has had 2 recent deaths in the family   Getting back on ozempic   Diet Recall Doesn't have fried chicken that often Small french fry when they eat out   Loves vinegrette Loves salad  Doesn't like leftovers much  Does not like eating out  Does not like brown rice   Makes there own butter  Doesn't have to eat out too much   Talked about getting on a Dexcom sample - Pt declined and was open to trying it in a few months when they come back for their follow up with the doctor Patient was informed about the behavioral health counselor in office in case she wanted to see her.  Talked to patient about ozempic and what it does to the body. Informed patient about the recommendation to increase fiber while taking ozempic to decrease weight gain when getting off of ozempic  Talked to her about glucose monitoring  Patient was given a coupon to get the necessary materials to use her glucometer  Weight as of today is - 239 lbs 9.6 oz  Lab Results  Component Value Date   HGBA1C 10.6 (A) 05/14/2023   HGBA1C 6.0 (A) 08/11/2022   HGBA1C 7.9 (A) 04/30/2022     Diabetes Self-Management Education - 05/14/23 1300       Visit Information   Visit Type Follow-up      Health Coping   How would you rate your overall health? Fair      Psychosocial Assessment   Patient Belief/Attitude about Diabetes Defeat/Burnout   A1c has risen and morale is  low because of that and other social issues going on currently   What is the hardest part about your diabetes right now, causing you the most concern, or is the most worrisome to you about your diabetes?   Being active;Checking blood sugar;Getting support / problem solving    Self-care barriers Unable to determine    Self-management support Family;Doctor's office;CDE visits    Other persons present Other (comment)   Dietetic Intern   Patient Concerns Glycemic Control;Healthy Lifestyle;Other (comment)   Physical Activity   Preferred Learning Style No preference indicated    Learning Readiness Contemplating      Pre-Education Assessment   Patient understands the diabetes disease and treatment process. Demonstrates understanding / competency    Patient understands incorporating nutritional management into lifestyle. Comprehends key points    Patient undertands incorporating physical activity into lifestyle. Comprehends key points    Patient understands using medications safely. Needs Review    Patient understands monitoring blood glucose, interpreting and using results Needs Review    Patient understands how to develop strategies to promote health/change behavior. Needs Instruction      Complications   Last HgB A1C per patient/outside source 10.6 %    How often do you check your blood sugar? 1-2 times/day    Fasting Blood glucose range (mg/dL) --   Not assessed  at this time   Postprandial Blood glucose range (mg/dL) --   Not assessed at this time     Patient Education   Healthy Eating Role of diet in the treatment of diabetes and the relationship between the three main macronutrients and blood glucose level    Being Active Other (comment)   Discused exercise and Opprotunities she may have to exercise   Medications Other (comment);Taught/reviewed insulin/injectables, injection, site rotation, insulin/injectables storage and needle disposal.   Discussed Ozempic injection and what the medicine  does in the body   Monitoring Taught/evaluated CGM (comment)   Discussed what a CGM is and how it works to monitor blood glucose   Diabetes Stress and Support Identified and addressed patients feelings and concerns about diabetes;Worked with patient to identify barriers to care and solutions;Other (comment)   Informed patient about Behavioral Health counselor located in office   Lifestyle and Health Coping --   Needs to be assessed at future visit     Post-Education Assessment   Patient understands incorporating nutritional management into lifestyle. Comprehends key points    Patient undertands incorporating physical activity into lifestyle. Demonstrates understanding / competency    Patient understands using medications safely. Comphrehends key points    Patient understands monitoring blood glucose, interpreting and using results Comprehends key points    Patient understands how to develop strategies to address psychosocial issues. Needs Review      Outcomes   Expected Outcomes Demonstrated interest in learning but significant barriers to change    Future DMSE 3-4 months    Program Status Not Completed             Individualized Plan for Diabetes Self-Management Training:   Learning Objective:  Patient will have a greater understanding of diabetes self-management. Patient education plan is to attend individual and/or group sessions per assessed needs and concerns.   Plan:   There are no Patient Instructions on file for this visit.  Expected Outcomes:  Demonstrated interest in learning but significant barriers to change  Education material provided:   If problems or questions, patient to contact team via:  Phone and Email  Future DSME appointment: 3-4 months  Laverta Baltimore, Student-Dietician 05/14/2023 2:27 PM.

## 2023-05-14 NOTE — Patient Instructions (Addendum)
Thank you, Ms.Minna Dorann Ou for allowing [place] to provide your care today.  I have ordered the following tests for you:  Lab Orders         Microalbumin / Creatinine Urine Ratio         BMP8+Anion Gap         Glucose, capillary         Lipid Profile         POC Hbg A1C      Mammogram Thyroid ultrasound   Referrals ordered today:   Referral Orders         Ambulatory referral to Ophthalmology         Referral to Nutrition and Diabetes Services      I have ordered the following medication/changed the following medications:   Start the following medications: Meds ordered this encounter  Medications   Semaglutide,0.25 or 0.5MG /DOS, 2 MG/3ML SOPN    Sig: Inject 0.25 mg into the skin once a week.    Dispense:  3 mL    Refill:  1   pantoprazole (PROTONIX) 40 MG tablet    Sig: Take 1 tablet (40 mg total) by mouth daily.    Dispense:  90 tablet    Refill:  1     Follow up: 3 months for diabetes follow-up  Please remember:   I will be very important for you to follow with radiology for your thyroid ultrasounds and repeat mammogram.   [facility] OFFICE [address], [place], [place], [address] T [phone] F [phone]     Recommendations for weight loss and blood glucose control.   1) Cut out liquid sources of calories such as soda, juices, sports drinks, alcohol, sweet teas, and coffee with added sugars. These are "empty calories" meaning they bring your blood sugar up and add weight to your body without making you feel full.   2) Avoid food with added sugars. Some examples would be cakes, candies, and sauces (ie. ketchup and BBQ). You may be surprised how much sugar they sneak into processed foods.   3) Replace foods high in starches such as pasta, rice, potatoes, or breads with foods higher in protein such as meat.  Protein rich foods are more satiating - meaning they make you feel more full than carbohydrates. They also provide the body with  important nutrients that we need. You can read nutritional labels to get more information regarding how much protein, carbohydrates, and fats foods contain.     We look forward to seeing you next time. Please call our clinic at [phone] if you have any questions or concerns. The best time to call is Monday-Friday from 9am-4pm, but there is someone available 24/7. If after hours or the weekend, call the main hospital number and ask for the Internal Medicine Resident On-Call. If you need medication refills, please notify your pharmacy one week in advance and they will send [place] a request.   Thank you for trusting me with your care. Wishing you the best!  Lovie Macadamia MD [facility]

## 2023-05-14 NOTE — Assessment & Plan Note (Signed)
Previous left and right breast biopsies demonstrating intraductal papilloma, recommended reimaging at 6 months and March 2024. Plan: Orders placed for breast imaging

## 2023-05-14 NOTE — Assessment & Plan Note (Addendum)
Last visit in April 2024, A1c at that time was improved from 7.9 - 6.0.  Was on a regimen of Ozempic 0.5 mg weekly as well as Farxiga 10 mg daily.  Patient states that he stopped Ozempic a few months ago due to bloating and acid reflux.  She has been intolerant of metformin in the past due to GI upset and diarrhea.  She does endorse some ongoing diarrhea and constipation, but this appears to be unrelated to her Ozempic usage.  A1c today uncontrolled at 10.6.  This was discussed with the patient, we will attempt to optimize diet and medication regiment in hopes of avoiding initiation of insulin at this time.  Plan: Diabetic foot exam done today Microalbumin creatinine ratio collected Will follow with diabetic eye doctor Will initiate Ozempic 0.25 mg weekly Will initiate Protonix 40 mg daily to help mitigate side effects of Ozempic. Patient given same-day appointment Lupita Leash, met with her directly after our discussion. Discussion regarding nutrition and lifestyle modification at this visit as well. Lipid panel BMP to assess kidney function

## 2023-05-14 NOTE — Assessment & Plan Note (Signed)
Thyroid nodule which was biopsied in the past, recommended to repeat thyroid ultrasonography around 81-month mark-which will be February 2025. Plan: Orders placed for thyroid ultrasound

## 2023-05-14 NOTE — Assessment & Plan Note (Signed)
Currently on olmesartan 40 mg.  Blood pressure today elevated at 150/91, patient states to take the blood pressure at home and has been in the normal range consistently.  May reflect whitecoat hypertension, will continue to evaluate further visits.  May consider additional agent if persistently elevated.  Appears to have had ankle swelling with amlodipine in the past.

## 2023-05-15 ENCOUNTER — Other Ambulatory Visit (HOSPITAL_COMMUNITY): Payer: Self-pay

## 2023-05-15 ENCOUNTER — Encounter: Payer: Self-pay | Admitting: Student

## 2023-05-15 ENCOUNTER — Telehealth: Payer: Self-pay

## 2023-05-15 ENCOUNTER — Other Ambulatory Visit: Payer: Self-pay | Admitting: Student

## 2023-05-15 ENCOUNTER — Other Ambulatory Visit: Payer: Self-pay

## 2023-05-15 DIAGNOSIS — E1165 Type 2 diabetes mellitus with hyperglycemia: Secondary | ICD-10-CM

## 2023-05-15 DIAGNOSIS — E785 Hyperlipidemia, unspecified: Secondary | ICD-10-CM

## 2023-05-15 LAB — BMP8+ANION GAP
Anion Gap: 18 mmol/L (ref 10.0–18.0)
BUN/Creatinine Ratio: 28 — ABNORMAL HIGH (ref 9–23)
BUN: 20 mg/dL (ref 6–24)
CO2: 21 mmol/L (ref 20–29)
Calcium: 9.6 mg/dL (ref 8.7–10.2)
Chloride: 99 mmol/L (ref 96–106)
Creatinine, Ser: 0.72 mg/dL (ref 0.57–1.00)
Glucose: 212 mg/dL — ABNORMAL HIGH (ref 70–99)
Potassium: 4.4 mmol/L (ref 3.5–5.2)
Sodium: 138 mmol/L (ref 134–144)
eGFR: 102 mL/min/{1.73_m2} (ref >59)

## 2023-05-15 LAB — MICROALBUMIN / CREATININE URINE RATIO
Creatinine, Urine: 81.1 mg/dL
Microalb/Creat Ratio: 25 mg/g{creat} (ref 0–29)
Microalbumin, Urine: 20.1 ug/mL

## 2023-05-15 LAB — LIPID PANEL
Chol/HDL Ratio: 6.1 {ratio} — ABNORMAL HIGH (ref 0.0–4.4)
Cholesterol, Total: 275 mg/dL — ABNORMAL HIGH (ref 100–199)
HDL: 45 mg/dL (ref >39)
LDL Chol Calc (NIH): 196 mg/dL — ABNORMAL HIGH (ref 0–99)
Triglycerides: 180 mg/dL — ABNORMAL HIGH (ref 0–149)
VLDL Cholesterol Cal: 34 mg/dL (ref 5–40)

## 2023-05-15 MED ORDER — ATORVASTATIN CALCIUM 80 MG PO TABS
80.0000 mg | ORAL_TABLET | Freq: Every day | ORAL | 3 refills | Status: DC
  Filled 2023-05-15: qty 90, 90d supply, fill #0

## 2023-05-15 NOTE — Telephone Encounter (Signed)
Pharmacy Patient Advocate Encounter   Received notification from CoverMyMeds that prior authorization for [facility] is required/requested.   Insurance verification completed.   The patient is insured through [employer] .   PA required; PA submitted to above mentioned insurance via CoverMyMeds Key/confirmation #/EOC [id number]. Status is pending

## 2023-05-15 NOTE — Progress Notes (Unsigned)
Elevated LDL on recent labs. T2DM. Initiating high intensity statin - Lipitor 80mg   The 10-year ASCVD risk score (Arnett DK, et al., 2019) is: 25.1%   Values used to calculate the score:     Age: 53 years     Sex: Female     Is Non-Hispanic African American: Yes     Diabetic: Yes     Tobacco smoker: No     Systolic Blood Pressure: 150 mmHg     Is BP treated: Yes     HDL Cholesterol: 45 mg/dL     Total Cholesterol: 275 mg/dL

## 2023-05-18 ENCOUNTER — Telehealth: Payer: Self-pay

## 2023-05-18 ENCOUNTER — Other Ambulatory Visit (HOSPITAL_COMMUNITY): Payer: Self-pay

## 2023-05-18 NOTE — Telephone Encounter (Signed)
Pharmacy Patient Advocate Encounter   Received notification from CoverMyMeds that prior authorization for [facility] 0.25/0.5MG  is required/requested.   The patient is insured through [employer] .   PA required; PA submitted to above mentioned insurance via CoverMyMeds Key/confirmation #/EOC [id number]. Status is pending

## 2023-05-18 NOTE — Telephone Encounter (Signed)
Pharmacy Patient Advocate Encounter  Received notification from [employer] that Prior Authorization for FARXIGA 10MG  has been APPROVED from 05/15/23 to FURTHER NOTICE   PA #/Case ID/Reference #: [id number]

## 2023-05-18 NOTE — Progress Notes (Signed)
Internal Medicine Clinic Attending  Case discussed with the resident at the time of the visit.  We reviewed the resident's history and exam and pertinent patient test results.  I agree with the assessment, diagnosis, and plan of care documented in the resident's note.  Repeat thyroid ultrasound ordered to evaluate additional thyroid nodules that were not previously biopsied.

## 2023-05-22 ENCOUNTER — Other Ambulatory Visit (HOSPITAL_COMMUNITY): Payer: Self-pay

## 2023-05-22 ENCOUNTER — Encounter: Payer: 59 | Admitting: Student

## 2023-05-25 ENCOUNTER — Encounter: Payer: 59 | Admitting: Internal Medicine

## 2023-05-26 NOTE — Telephone Encounter (Signed)
Pharmacy Patient Advocate Encounter  Received notification from [employer] that Prior Authorization for [facility] has been APPROVED from 05/18/23 to 05/21/24   PA #/Case ID/Reference #: [id number]

## 2023-06-04 ENCOUNTER — Other Ambulatory Visit: Payer: Self-pay

## 2023-06-10 ENCOUNTER — Encounter: Payer: Managed Care, Other (non HMO) | Admitting: Dietician

## 2023-06-17 ENCOUNTER — Ambulatory Visit: Payer: Managed Care, Other (non HMO)

## 2023-06-17 ENCOUNTER — Ambulatory Visit
Admission: RE | Admit: 2023-06-17 | Discharge: 2023-06-17 | Disposition: A | Discharge status: Home / Self Care | Payer: Managed Care, Other (non HMO) | Source: Ambulatory Visit | Attending: Internal Medicine | Admitting: Internal Medicine

## 2023-06-17 DIAGNOSIS — D241 Benign neoplasm of right breast: Secondary | ICD-10-CM

## 2023-06-19 ENCOUNTER — Ambulatory Visit (HOSPITAL_COMMUNITY)
Admission: RE | Admit: 2023-06-19 | Discharge: 2023-06-19 | Disposition: A | Discharge status: Home / Self Care | Payer: Managed Care, Other (non HMO) | Source: Ambulatory Visit | Attending: Internal Medicine | Admitting: Internal Medicine

## 2023-06-19 DIAGNOSIS — E042 Nontoxic multinodular goiter: Secondary | ICD-10-CM | POA: Insufficient documentation

## 2023-06-23 ENCOUNTER — Encounter: Payer: Self-pay | Admitting: Student

## 2023-06-29 ENCOUNTER — Encounter: Payer: Self-pay | Admitting: Student

## 2023-07-20 ENCOUNTER — Other Ambulatory Visit (HOSPITAL_COMMUNITY): Payer: Self-pay

## 2023-08-12 ENCOUNTER — Ambulatory Visit: Admitting: Student

## 2023-08-12 ENCOUNTER — Other Ambulatory Visit (HOSPITAL_COMMUNITY): Payer: Self-pay

## 2023-08-12 VITALS — BP 157/80 | HR 79 | Temp 98.4°F | Ht 68.0 in | Wt 247.8 lb

## 2023-08-12 DIAGNOSIS — Z7985 Long-term (current) use of injectable non-insulin antidiabetic drugs: Secondary | ICD-10-CM

## 2023-08-12 DIAGNOSIS — K219 Gastro-esophageal reflux disease without esophagitis: Secondary | ICD-10-CM | POA: Diagnosis not present

## 2023-08-12 DIAGNOSIS — E1165 Type 2 diabetes mellitus with hyperglycemia: Secondary | ICD-10-CM | POA: Diagnosis not present

## 2023-08-12 DIAGNOSIS — I10 Essential (primary) hypertension: Secondary | ICD-10-CM

## 2023-08-12 DIAGNOSIS — Z7984 Long term (current) use of oral hypoglycemic drugs: Secondary | ICD-10-CM

## 2023-08-12 DIAGNOSIS — E785 Hyperlipidemia, unspecified: Secondary | ICD-10-CM | POA: Diagnosis not present

## 2023-08-12 DIAGNOSIS — E119 Type 2 diabetes mellitus without complications: Secondary | ICD-10-CM

## 2023-08-12 LAB — POCT GLYCOSYLATED HEMOGLOBIN (HGB A1C): Hemoglobin A1C: 7.2 % — AB (ref 4.0–5.6)

## 2023-08-12 LAB — GLUCOSE, CAPILLARY: Glucose-Capillary: 102 mg/dL — ABNORMAL HIGH (ref 70–99)

## 2023-08-12 MED ORDER — DAPAGLIFLOZIN PROPANEDIOL 10 MG PO TABS
10.0000 mg | ORAL_TABLET | Freq: Every day | ORAL | 3 refills | Status: AC
  Filled 2023-08-12: qty 90, 90d supply, fill #0
  Filled 2023-11-13: qty 90, 90d supply, fill #1
  Filled 2024-02-12: qty 90, 90d supply, fill #2
  Filled 2024-05-12: qty 90, 90d supply, fill #3

## 2023-08-12 MED ORDER — AMLODIPINE BESYLATE 5 MG PO TABS
5.0000 mg | ORAL_TABLET | Freq: Every day | ORAL | 11 refills | Status: AC
  Filled 2023-08-12: qty 30, 30d supply, fill #0
  Filled 2023-09-25: qty 90, 90d supply, fill #1
  Filled 2023-12-24: qty 90, 90d supply, fill #2
  Filled 2024-03-28: qty 90, 90d supply, fill #3
  Filled 2024-05-12: qty 30, 30d supply, fill #4

## 2023-08-12 MED ORDER — OLMESARTAN MEDOXOMIL 40 MG PO TABS
40.0000 mg | ORAL_TABLET | Freq: Every day | ORAL | 3 refills | Status: AC
  Filled 2023-08-12: qty 90, 90d supply, fill #0
  Filled 2023-11-13: qty 90, 90d supply, fill #1
  Filled 2024-03-07: qty 90, 90d supply, fill #2

## 2023-08-12 MED ORDER — SEMAGLUTIDE(0.25 OR 0.5MG/DOS) 2 MG/3ML ~~LOC~~ SOPN
0.2500 mg | PEN_INJECTOR | SUBCUTANEOUS | 1 refills | Status: DC
  Filled 2023-08-12: qty 3, 28d supply, fill #0
  Filled 2023-09-25: qty 3, 28d supply, fill #1

## 2023-08-12 MED ORDER — PANTOPRAZOLE SODIUM 40 MG PO TBEC
40.0000 mg | DELAYED_RELEASE_TABLET | Freq: Every day | ORAL | 1 refills | Status: DC
  Filled 2023-08-12: qty 90, 90d supply, fill #0
  Filled 2023-11-13: qty 90, 90d supply, fill #1

## 2023-08-12 MED ORDER — ATORVASTATIN CALCIUM 80 MG PO TABS
80.0000 mg | ORAL_TABLET | Freq: Every day | ORAL | 3 refills | Status: DC
  Filled 2023-08-12: qty 90, 90d supply, fill #0
  Filled 2023-11-13: qty 90, 90d supply, fill #1
  Filled 2024-02-12: qty 90, 90d supply, fill #2

## 2023-08-12 NOTE — Patient Instructions (Addendum)
 Thank you, Ms.Xzaria Frederico Jan for allowing us  to provide your care today. Today we discussed your diabetes.  I have ordered the following labs for you:  Lab Orders         Glucose, capillary      Tests ordered today:  None  Referrals ordered today:   Referral Orders  No referral(s) requested today     I have ordered the following medication/changed the following medications:   Stop the following medications: There are no discontinued medications.   Start the following medications: No orders of the defined types were placed in this encounter.    Follow up: 1 month for blood pressure and diabetes medication management    Remember:   For your diabetes: We increased your Ozempic  0.25 mg weekly injection to 0.50 mg weekly, you can start this new dose this Friday 08/14/23  For your blood pressure:  - We have added amlodipine  5 mg daily to your regimen. Your other blood pressure medication is olmesartan  40 mg daily. We will see you in 1 month to check your blood pressure and make adjustments as needed.   Your A1c today is 7.2%.  Should you have any questions or concerns please call the internal medicine clinic at [phone].     Markees Carns Arellano Zameza, MD PGY-1 Internal Medicine Teaching Progam [facility]

## 2023-08-12 NOTE — Progress Notes (Signed)
 Established Patient Office Visit  Subjective   Patient ID: Alyssa Buck, female    DOB: [birth year]  Age: 53 y.o. MRN: [id number]  Chief Complaint  Patient presents with   Follow-up    Routine office visit for DM / medication refill .    Patient is a 53 y.o. with a past medical history stated below who presents today for follow-up for diabetes and elevated blood pressure. She was last seen at [facility] on 05/14/2023. Please see problem based assessment and plan for additional details.     Past Medical History:  Diagnosis Date   Diabetes (HCC)    Hypertension    Review of Systems  Cardiovascular:  Negative for chest pain, palpitations and leg swelling.  Gastrointestinal:  Positive for nausea. Negative for abdominal pain and vomiting.  Genitourinary:  Negative for dysuria.     Objective:     BP (!) 157/80 (BP Location: Right Arm, Patient Position: Sitting, Cuff Size: Normal)   Pulse 79   Temp 98.4 F (36.9 C) (Oral)   Ht 5\' 8"  (1.727 m)   Wt 247 lb 12.8 oz (112.4 kg)   SpO2 100%   BMI 37.68 kg/m  BP Readings from Last 3 Encounters:  08/12/23 (!) 157/80  05/14/23 (!) 150/91  09/04/22 138/86   Wt Readings from Last 3 Encounters:  08/12/23 247 lb 12.8 oz (112.4 kg)  05/14/23 239 lb 9.6 oz (108.7 kg)  09/04/22 236 lb 12.8 oz (107.4 kg)      Physical Exam HENT:     Head: Normocephalic and atraumatic.  Cardiovascular:     Rate and Rhythm: Normal rate and regular rhythm.     Heart sounds: Normal heart sounds.  Pulmonary:     Effort: Pulmonary effort is normal.     Breath sounds: Normal breath sounds.  Abdominal:     General: Bowel sounds are normal.     Palpations: Abdomen is soft.  Skin:    General: Skin is warm.  Neurological:     General: No focal deficit present.     Mental Status: She is alert.  Psychiatric:        Mood and Affect: Mood normal.     Results for orders placed or performed in visit on 08/12/23  Glucose, capillary  Result Value Ref  Range   Glucose-Capillary 102 (H) 70 - 99 mg/dL  POC Hbg W0J  Result Value Ref Range   Hemoglobin A1C 7.2 (A) 4.0 - 5.6 %   HbA1c POC (<> result, manual entry)     HbA1c, POC (prediabetic range)     HbA1c, POC (controlled diabetic range)     Last hemoglobin A1c Lab Results  Component Value Date   HGBA1C 7.2 (A) 08/12/2023   The 10-year ASCVD risk score (Arnett DK, et al., 2019) is: 29.5%    Assessment & Plan:   Problem List Items Addressed This Visit     Essential hypertension   Patient's blood pressure today 175/105, HR 85. Repeat blood pressure 157/80. Denies chest pain, heart palpitations, headache, or blurry vision. Current regimen includes olmesartan  40 mg daily. Has been on amlodipine  10 mg (caused leg swelling) and lisinopril  (caused a cough). Previously BP was controlled on a combination of amlodipine  10 mg and losartan  25 mg. CV exam unremarkable today, no lower extremity swelling observed. Discussed adding another agent to her antihypertensive regimen for better BP control. Reviewed data on combination of ARB + amlodipine , and decreased lower extremity swelling on a half dose  of amlodipine . Patient agreeable to re-starting amlodipine  at a lower dose.  Plan - CONTINUE olmesartan  40 mg daily  - START amlodipine  5 mg daily  - Return to clinic in 1 month for blood pressure check and medication adjustments as needed       Relevant Medications   atorvastatin  (LIPITOR) 80 MG tablet   olmesartan  (BENICAR ) 40 MG tablet   amLODipine  (NORVASC ) 5 MG tablet   Type 2 diabetes mellitus with hyperglycemia (HCC) - Primary   Patient restarted on Ozempic  0.25 mg weekly plus pantoprazole  40 mg daily to mitigate abdominal symptoms of Ozempic  at 05/14/23 visit. Today she reports doing well on current regimen but has not observed any weight changes. Has had about 5-6x doses of Ozempic  0.25 mg, which she injects on Fridays. Denies adverse effects. Discussed lifestyle/diet modifications with  Abe Abed, has been eating less carbs or replacing carbs with whole wheat. Is also working on decreasing portion sizes. Other diabetes medications include Farxiga  10 mg daily. A1c today 7.2% compared to 10.6% in January. Patient was very happy with this change, stated her overall goal is to go from 239 lbs to about 190-200 lbs to help feel even better. Since she is doing well on the Ozempic  + protonix  combination, she is agreeable to starting 0.5 mg dose this week. If tolerated, she will continue on 0.5 mg until follow up visit in 1 month at which time the dose can be adjusted as needed.   Of note patient did follow up with Retina specialist, Dr. Govin, has begun monthly injections.   Plan - INCREASE Ozempic  0.25 mg injection/weekly to 0.5 mg (will start Friday 5/2) - Continue pantoprazole /protonix  40 mg daily  - Continue Farxiga  10 mg daily  - Return to clinic in 1 month to determine if patient is tolerating 0.5 mg dose and whether she would like to go up to 1 mg/weekly injections at that time      Relevant Medications   atorvastatin  (LIPITOR) 80 MG tablet   olmesartan  (BENICAR ) 40 MG tablet   dapagliflozin  propanediol (FARXIGA ) 10 MG TABS tablet   Semaglutide ,0.25 or 0.5MG /DOS, 2 MG/3ML SOPN   Other Relevant Orders   POC Hbg A1C (Completed)   Hyperlipidemia   LDL 196 on 05/14/23. Has been taking atorvastatin  80 mg daily consistently. Denies any adverse side effects. Plan to repeat lipid panel around the end of July to help determine if additional cholesterol medication is needed.  Plan - Continue atorvastatin  80 mg daily       Relevant Medications   atorvastatin  (LIPITOR) 80 MG tablet   olmesartan  (BENICAR ) 40 MG tablet   amLODipine  (NORVASC ) 5 MG tablet   Other Visit Diagnoses       Gastroesophageal reflux disease, unspecified whether esophagitis present       Relevant Medications   pantoprazole  (PROTONIX ) 40 MG tablet     Type 2 diabetes mellitus without complication, without  long-term current use of insulin (HCC)       Relevant Medications   atorvastatin  (LIPITOR) 80 MG tablet   olmesartan  (BENICAR ) 40 MG tablet   dapagliflozin  propanediol (FARXIGA ) 10 MG TABS tablet   Semaglutide ,0.25 or 0.5MG /DOS, 2 MG/3ML SOPN      Patient discussed with Dr. Adriane Albe.  Return in about 4 weeks (around 09/09/2023) for blood pressure and diabetes medication management.   Brielynn Sekula Arellano Zameza, MD PGY-1, [facility]

## 2023-08-13 NOTE — Assessment & Plan Note (Signed)
 LDL 196 on 05/14/23. Has been taking atorvastatin  80 mg daily consistently. Denies any adverse side effects. Plan to repeat lipid panel around the end of July to help determine if additional cholesterol medication is needed.  Plan - Continue atorvastatin  80 mg daily

## 2023-08-13 NOTE — Assessment & Plan Note (Signed)
 Patient's blood pressure today 175/105, HR 85. Repeat blood pressure 157/80. Denies chest pain, heart palpitations, headache, or blurry vision. Current regimen includes olmesartan  40 mg daily. Has been on amlodipine  10 mg (caused leg swelling) and lisinopril  (caused a cough). Previously BP was controlled on a combination of amlodipine  10 mg and losartan  25 mg. CV exam unremarkable today, no lower extremity swelling observed. Discussed adding another agent to her antihypertensive regimen for better BP control. Reviewed data on combination of ARB + amlodipine , and decreased lower extremity swelling on a half dose of amlodipine . Patient agreeable to re-starting amlodipine  at a lower dose.  Plan - CONTINUE olmesartan  40 mg daily  - START amlodipine  5 mg daily  - Return to clinic in 1 month for blood pressure check and medication adjustments as needed

## 2023-08-13 NOTE — Assessment & Plan Note (Signed)
 Patient restarted on Ozempic  0.25 mg weekly plus pantoprazole  40 mg daily to mitigate abdominal symptoms of Ozempic  at 05/14/23 visit. Today she reports doing well on current regimen but has not observed any weight changes. Has had about 5-6x doses of Ozempic  0.25 mg, which she injects on Fridays. Denies adverse effects. Discussed lifestyle/diet modifications with Abe Abed, has been eating less carbs or replacing carbs with whole wheat. Is also working on decreasing portion sizes. Other diabetes medications include Farxiga  10 mg daily. A1c today 7.2% compared to 10.6% in January. Patient was very happy with this change, stated her overall goal is to go from 239 lbs to about 190-200 lbs to help feel even better. Since she is doing well on the Ozempic  + protonix  combination, she is agreeable to starting 0.5 mg dose this week. If tolerated, she will continue on 0.5 mg until follow up visit in 1 month at which time the dose can be adjusted as needed.   Of note patient did follow up with Retina specialist, Dr. Govin, has begun monthly injections.   Plan - INCREASE Ozempic  0.25 mg injection/weekly to 0.5 mg (will start Friday 5/2) - Continue pantoprazole /protonix  40 mg daily  - Continue Farxiga  10 mg daily  - Return to clinic in 1 month to determine if patient is tolerating 0.5 mg dose and whether she would like to go up to 1 mg/weekly injections at that time

## 2023-08-15 NOTE — Progress Notes (Signed)
 Internal Medicine Clinic Attending  Case discussed with the resident at the time of the visit.  We reviewed the resident's history and exam and pertinent patient test results.  I agree with the assessment, diagnosis, and plan of care documented in the resident's note.

## 2023-09-10 ENCOUNTER — Encounter: Admitting: Student

## 2023-09-23 ENCOUNTER — Telehealth: Payer: Self-pay

## 2023-09-23 NOTE — Telephone Encounter (Signed)
 Patient was identified as falling into the [place] Measure - Diabetes.   Patient was: Appointment already scheduled for:  6/16/@3 :15.

## 2023-09-25 ENCOUNTER — Other Ambulatory Visit (HOSPITAL_COMMUNITY): Payer: Self-pay

## 2023-09-28 ENCOUNTER — Ambulatory Visit: Admitting: Student

## 2023-09-28 ENCOUNTER — Encounter: Payer: Self-pay | Admitting: Student

## 2023-09-28 ENCOUNTER — Other Ambulatory Visit (HOSPITAL_COMMUNITY): Payer: Self-pay

## 2023-09-28 ENCOUNTER — Other Ambulatory Visit: Payer: Self-pay

## 2023-09-28 VITALS — BP 156/90 | HR 79 | Temp 98.6°F | Ht 68.0 in | Wt 240.0 lb

## 2023-09-28 DIAGNOSIS — E1165 Type 2 diabetes mellitus with hyperglycemia: Secondary | ICD-10-CM

## 2023-09-28 DIAGNOSIS — I10 Essential (primary) hypertension: Secondary | ICD-10-CM | POA: Diagnosis not present

## 2023-09-28 DIAGNOSIS — Z7985 Long-term (current) use of injectable non-insulin antidiabetic drugs: Secondary | ICD-10-CM

## 2023-09-28 DIAGNOSIS — Z7984 Long term (current) use of oral hypoglycemic drugs: Secondary | ICD-10-CM | POA: Diagnosis not present

## 2023-09-28 MED ORDER — SEMAGLUTIDE (1 MG/DOSE) 4 MG/3ML ~~LOC~~ SOPN
1.0000 mg | PEN_INJECTOR | SUBCUTANEOUS | 2 refills | Status: DC
  Filled 2023-09-28: qty 3, 28d supply, fill #0
  Filled 2023-10-22: qty 3, 28d supply, fill #1
  Filled 2023-11-26: qty 3, 28d supply, fill #2

## 2023-09-28 NOTE — Patient Instructions (Signed)
 Alyssa Buck,Thank you for allowing me to take part in your care today.  Here are your instructions.  1. You are safe to go up to the 1 mg dose of ozempic , you have lost 7 lbs, keep up the good work  2. As a reminder you A1c is 7.2, it is close to goal, you are doing a good job  3. Please keep a blood pressure log and bring that and the cuff at your next visit   4. Return in about 6 weeks and we can check your A1c at that time.    PLEASE BRING YOUR MEDICATIONS TO EVERY APPOINTMENT  Thank you, Dr. Lydia Sams  If you have any other questions please contact the internal medicine clinic at [phone] If it is after hours, please call the [facility] at [phone] and then ask the person who picks up for the resident on call.

## 2023-09-28 NOTE — Assessment & Plan Note (Signed)
 Patient has a past medical history of hypertension.  Blood pressure today is 145/92 and on recheck it was 156 systolic.  She did take her olmesartan  today, but she ran out of her amlodipine  because she did not pick it up.  She did not know she had refills.  I encouraged her to go pick up her amlodipine .  I encouraged her to keep a blood pressure log and bring her cuff at her next visit.  Plan: - Continue olmesartan  40 mg daily - Continue amlodipine  5 mg daily - Follow-up in 1 month for blood pressure check and at that time she can bring her log and cuff

## 2023-09-28 NOTE — Progress Notes (Signed)
 CC: Diabetes follow-up  HPI:  [name] is a 53 y.o. female with a past medical history of hypertension and type 2 diabetes following up for diabetes follow-up.  Please see assessment and plan for full HPI.  Medications: Hypertension: Norvasc  5 mg daily, olmesartan  40 mg daily GERD: Protonix  40 mg daily Diabetes: Semaglutide  1 mg weekly, Farxiga  10 mg daily Hyperlipidemia: Atorvastatin  80 mg daily   Past Medical History:  Diagnosis Date   Diabetes (HCC)    Hypertension      Current Outpatient Medications:    Semaglutide , 1 MG/DOSE, 4 MG/3ML SOPN, Inject 1 mg into the skin once a week., Disp: 3 mL, Rfl: 2   amLODipine  (NORVASC ) 5 MG tablet, Take 1 tablet (5 mg total) by mouth daily., Disp: 30 tablet, Rfl: 11   atorvastatin  (LIPITOR) 80 MG tablet, Take 1 tablet (80 mg total) by mouth daily., Disp: 90 tablet, Rfl: 3   dapagliflozin  propanediol (FARXIGA ) 10 MG TABS tablet, Take 1 tablet (10 mg total) by mouth daily with breakfast., Disp: 90 tablet, Rfl: 3   glucose blood (ONETOUCH VERIO) test strip, Check blood sugar two times daily, Disp: 200 each, Rfl: 5   Microlet Lancets MISC, Use to test blood sugar 2 (two) times daily., Disp: 100 each, Rfl: 4   olmesartan  (BENICAR ) 40 MG tablet, Take 1 tablet (40 mg total) by mouth daily., Disp: 90 tablet, Rfl: 3   pantoprazole  (PROTONIX ) 40 MG tablet, Take 1 tablet (40 mg total) by mouth daily., Disp: 90 tablet, Rfl: 1  Review of Systems:    Negative except for what is stated in HPI  Physical Exam:  Vitals:   09/28/23 1515 09/28/23 1548  BP: (!) 145/92 (!) 156/90  Pulse: 79   Temp: 98.6 F (37 C)   TempSrc: Oral   SpO2: 97%   Weight: 240 lb (108.9 kg)   Height: 5' 8 (1.727 m)    General: Patient is sitting comfortably in the room  Head: Normocephalic, atraumatic  Cardio: Regular rate and rhythm, no murmurs, rubs or gallops Pulmonary: Clear to ausculation bilaterally with no rales, rhonchi, and crackles   Extremities: Foot exam showing no signs of infection, ulceration, does have 2+ pedal pulses, no signs of erythema or calluses   Assessment & Plan:   Type 2 diabetes mellitus with hyperglycemia (HCC) Patient has a past medical history of type 2 diabetes mellitus.  She is currently on semaglutide  0.5 mg weekly as well as Farxiga  10 mg daily.  At her last visit 4 weeks ago, she was increased to 0.5 mg of Ozempic  weekly.  She tolerated this well.  She does report having some constipation, but denies any nausea or vomiting.  She denies any major abdominal pain.  She reports adherence to the medication.  She states she would like to move up to 1 mg.  I agree with this.  She recently had an A1c on 04/30 which was 7.2.  She is approaching close to goal.  Will continue current medications.  She regularly sees an ophthalmologist.  Plan: - Updated foot exam today - Increase Ozempic  to 1 mg weekly - Continue Farxiga  10 mg daily - A1c in 6 weeks - Continue to follow with ophthalmologist for retinal exams  Essential hypertension Patient has a past medical history of hypertension.  Blood pressure today is 145/92 and on recheck it was 156 systolic.  She did take her olmesartan  today, but she ran out of her amlodipine  because she did not pick  it up.  She did not know she had refills.  I encouraged her to go pick up her amlodipine .  I encouraged her to keep a blood pressure log and bring her cuff at her next visit.  Plan: - Continue olmesartan  40 mg daily - Continue amlodipine  5 mg daily - Follow-up in 1 month for blood pressure check and at that time she can bring her log and cuff  Patient discussed with Dr. Joenathan Muslim, DO PGY-2 Internal Medicine Resident

## 2023-09-28 NOTE — Assessment & Plan Note (Signed)
 Patient has a past medical history of type 2 diabetes mellitus.  She is currently on semaglutide  0.5 mg weekly as well as Farxiga  10 mg daily.  At her last visit 4 weeks ago, she was increased to 0.5 mg of Ozempic  weekly.  She tolerated this well.  She does report having some constipation, but denies any nausea or vomiting.  She denies any major abdominal pain.  She reports adherence to the medication.  She states she would like to move up to 1 mg.  I agree with this.  She recently had an A1c on 04/30 which was 7.2.  She is approaching close to goal.  Will continue current medications.  She regularly sees an ophthalmologist.  Plan: - Updated foot exam today - Increase Ozempic  to 1 mg weekly - Continue Farxiga  10 mg daily - A1c in 6 weeks - Continue to follow with ophthalmologist for retinal exams

## 2023-10-02 NOTE — Progress Notes (Signed)
 Internal Medicine Clinic Attending  Case discussed with the resident at the time of the visit.  We reviewed the resident's history and exam and pertinent patient test results.  I agree with the assessment, diagnosis, and plan of care documented in the resident's note.

## 2023-11-12 ENCOUNTER — Ambulatory Visit: Admitting: Student

## 2023-11-12 VITALS — BP 130/84 | HR 87 | Temp 98.0°F | Ht 68.0 in | Wt 236.4 lb

## 2023-11-12 DIAGNOSIS — R194 Change in bowel habit: Secondary | ICD-10-CM

## 2023-11-12 DIAGNOSIS — E1165 Type 2 diabetes mellitus with hyperglycemia: Secondary | ICD-10-CM

## 2023-11-12 DIAGNOSIS — Z7984 Long term (current) use of oral hypoglycemic drugs: Secondary | ICD-10-CM

## 2023-11-12 DIAGNOSIS — I10 Essential (primary) hypertension: Secondary | ICD-10-CM

## 2023-11-12 DIAGNOSIS — Z7985 Long-term (current) use of injectable non-insulin antidiabetic drugs: Secondary | ICD-10-CM

## 2023-11-12 LAB — POCT GLYCOSYLATED HEMOGLOBIN (HGB A1C): Hemoglobin A1C: 5.6 % (ref 4.0–5.6)

## 2023-11-12 LAB — GLUCOSE, CAPILLARY: Glucose-Capillary: 100 mg/dL — ABNORMAL HIGH (ref 70–99)

## 2023-11-12 NOTE — Assessment & Plan Note (Signed)
 Improving quite drastically, A1c ~10 -> 5.6 in short 5-month span without insulin. Doing well with lifestyle interventions, eating better, adding some strength building exercises. Deferring Ozempic  increase for now given her GI symptoms. Continue 1 mg weekly. Continue farxiga  10 mg weekly.

## 2023-11-12 NOTE — Patient Instructions (Signed)
 Return in about 3 months (around 02/12/2024) for preventive medicine exam.  Remember to bring all of the medications that you take (including over the counter medications and supplements) with you to every clinic visit.  This after visit summary is an important review of tests, referrals, and medication changes that were discussed during your visit. If you have questions or concerns, call [phone]. Outside of clinic business hours, call the main hospital at [phone] and ask the operator for the on-call internal medicine resident.   Ozell Kung MD 11/12/2023, 2:51 PM

## 2023-11-12 NOTE — Progress Notes (Signed)
  Patient name: Alyssa Buck Date of birth: [birth year] Date of visit: 11/12/23  Subjective   Chief concern: diabetes follow-up  Likes to exercise, doing lots of cardio. Wants to incorporate more strengthening. Doing some lunges and pushups, using weights. Doing yoga in her office.  She is constipated. Started Sunday, got some relief on Tuesday with BM. Had a BM today but wasn't very relieving. Eating pattern has changed, she wonders if this might be responsible. Sometimes she goes all day without eating until dinner time.  She's trying to avoid excess carbohydrates by limiting bread.  Review of Systems  Gastrointestinal:  Positive for constipation and diarrhea (alternating diarrhea and constipation). Negative for abdominal pain, blood in stool, melena, nausea and vomiting.    Current Outpatient Medications  Medication Instructions   amLODipine  (NORVASC ) 5 mg, Oral, Daily   atorvastatin  (LIPITOR) 80 mg, Oral, Daily   Farxiga  10 mg, Oral, Daily with breakfast   glucose blood (ONETOUCH VERIO) test strip Check blood sugar two times daily   Microlet Lancets MISC Use to test blood sugar 2 (two) times daily.   olmesartan  (BENICAR ) 40 mg, Oral, Daily   Ozempic  (1 MG/DOSE) 1 mg, Subcutaneous, Weekly   pantoprazole  (PROTONIX ) 40 mg, Oral, Daily     Objective  Today's Vitals   11/12/23 1421  BP: 130/84  Pulse: 87  Temp: 98 F (36.7 C)  TempSrc: Oral  SpO2: 97%  Weight: 236 lb 6.4 oz (107.2 kg)  Height: 5' 8 (1.727 m)  Body mass index is 35.94 kg/m.   Physical Exam Constitutional:      Appearance: Normal appearance.  Cardiovascular:     Rate and Rhythm: Normal rate and regular rhythm.  Pulmonary:     Effort: Pulmonary effort is normal. No respiratory distress.  Abdominal:     Palpations: Abdomen is soft. There is no mass.     Tenderness: There is no abdominal tenderness.  Skin:    General: Skin is warm and dry.  Neurological:     Mental Status: She is alert.      Cranial Nerves: No facial asymmetry.  Psychiatric:        Mood and Affect: Affect normal.        Speech: Speech normal.        Behavior: Behavior normal.      Assessment & Plan   Type 2 diabetes mellitus with hyperglycemia, without long-term current use of insulin (HCC) Assessment & Plan: Improving quite drastically, A1c ~10 -> 5.6 in short 62-month span without insulin. Doing well with lifestyle interventions, eating better, adding some strength building exercises. Deferring Ozempic  increase for now given her GI symptoms. Continue 1 mg weekly. Continue farxiga  10 mg weekly.  Orders: -     POCT glycosylated hemoglobin (Hb A1C) -     Glucose, capillary  Essential hypertension Assessment & Plan: 130/84 today, near goal. Appropriate to continue amlodipine  5 mg daily and olmesartan  40 mg daily.   Bowel habit changes Assessment & Plan: Mixed constipation/diarrhea. No red flag signs/symptoms. Exam benign. Suspect side-effect of GLP-1 therapy. Doubt IBS because lacks chronicity for this. Hold off on GLP-1 increases for time being.     Return in about 3 months (around 02/12/2024) for preventive medicine exam.  Ozell Kung MD 11/12/2023, 4:52 PM

## 2023-11-12 NOTE — Assessment & Plan Note (Signed)
 130/84 today, near goal. Appropriate to continue amlodipine  5 mg daily and olmesartan  40 mg daily.

## 2023-11-12 NOTE — Assessment & Plan Note (Signed)
 Mixed constipation/diarrhea. No red flag signs/symptoms. Exam benign. Suspect side-effect of GLP-1 therapy. Doubt IBS because lacks chronicity for this. Hold off on GLP-1 increases for time being.

## 2023-11-13 ENCOUNTER — Other Ambulatory Visit (HOSPITAL_COMMUNITY): Payer: Self-pay

## 2023-11-26 ENCOUNTER — Other Ambulatory Visit (HOSPITAL_COMMUNITY): Payer: Self-pay

## 2023-12-03 NOTE — Progress Notes (Signed)
 Internal Medicine Clinic Attending  Case discussed with the resident at the time of the visit.  We reviewed the resident's history and exam and pertinent patient test results.  I agree with the assessment, diagnosis, and plan of care documented in the resident's note.

## 2023-12-24 ENCOUNTER — Other Ambulatory Visit (HOSPITAL_COMMUNITY): Payer: Self-pay

## 2023-12-24 ENCOUNTER — Other Ambulatory Visit: Payer: Self-pay | Admitting: Student

## 2023-12-24 MED ORDER — OZEMPIC (1 MG/DOSE) 4 MG/3ML ~~LOC~~ SOPN
1.0000 mg | PEN_INJECTOR | SUBCUTANEOUS | 2 refills | Status: DC
  Filled 2023-12-24: qty 3, 28d supply, fill #0
  Filled 2024-01-20 (×2): qty 3, 28d supply, fill #1
  Filled 2024-02-12: qty 3, 28d supply, fill #2

## 2024-01-20 ENCOUNTER — Other Ambulatory Visit (HOSPITAL_COMMUNITY): Payer: Self-pay

## 2024-01-21 ENCOUNTER — Other Ambulatory Visit (HOSPITAL_COMMUNITY): Payer: Self-pay

## 2024-02-10 ENCOUNTER — Ambulatory Visit (HOSPITAL_COMMUNITY)
Admission: EM | Admit: 2024-02-10 | Discharge: 2024-02-10 | Disposition: A | Discharge status: Home / Self Care | Attending: Emergency Medicine | Admitting: Emergency Medicine

## 2024-02-10 ENCOUNTER — Encounter (HOSPITAL_COMMUNITY): Payer: Self-pay

## 2024-02-10 DIAGNOSIS — M5442 Lumbago with sciatica, left side: Secondary | ICD-10-CM | POA: Diagnosis not present

## 2024-02-10 HISTORY — DX: Pure hypercholesterolemia, unspecified: E78.00

## 2024-02-10 MED ORDER — DEXAMETHASONE SOD PHOSPHATE PF 10 MG/ML IJ SOLN
10.0000 mg | Freq: Once | INTRAMUSCULAR | Status: AC
  Administered 2024-02-10: 10 mg via INTRAMUSCULAR

## 2024-02-10 MED ORDER — BACLOFEN 10 MG PO TABS: 10.0000 mg | ORAL_TABLET | Freq: Three times a day (TID) | ORAL | 0 refills | Status: DC

## 2024-02-10 NOTE — Discharge Instructions (Signed)
 You were given an injection of Decadron in clinic today to help with inflammation related to your pain. You can take baclofen every 8 hours as needed for muscle pain and spasms.  This can make you drowsy so do not drive, work, or drink alcohol while taking this. You can also take 500 to 1000 mg of Tylenol every 6-8 hours as needed for pain.  Do not exceed 4000 mg in 1 day. Alternate between heat and ice and do some gentle stretching to help with pain. You can follow-up with [facility] if your pain continues for further evaluation. Otherwise follow-up with your primary care provider or return here as needed.

## 2024-02-10 NOTE — ED Provider Notes (Addendum)
 [facility]    CSN: [id number] Arrival date & time: 02/10/24  1758      History   Chief Complaint Chief Complaint  Patient presents with   Back Pain    HPI Alyssa Buck is a 53 y.o. female.   Patient presents with left low back pain that radiates down her left leg.  Patient denies any recent fall or injury.  Patient denies any recent heavy lifting.  Patient denies any previous history of similar pain.  Patient states that she did take 1 dose of her previously prescribed muscle relaxer and extra strength Tylenol with some relief, but then the pain returned.  Patient states that she took the last dose of her previously prescribed muscle relaxer today.  Patient states that she does have some mild numbness to her left upper leg.  Denies saddle anesthesia or bowel/bladder incontinence.  The history is provided by the patient and medical records.  Back Pain   Past Medical History:  Diagnosis Date   Diabetes (HCC)    High cholesterol    Hypertension     Patient Active Problem List   Diagnosis Date Noted   Bowel habit changes 11/12/2023   Intraductal papilloma of breast, right 05/14/2023   Blurry vision, right eye 05/14/2023   Obesity, Class II, BMI 35-39.9 05/14/2022   Hyperlipidemia 05/02/2022   Knee pain, bilateral 01/20/2022   Type 2 diabetes mellitus with hyperglycemia (HCC) 09/22/2019   Essential hypertension 08/29/2019   Multinodular thyroid  08/29/2019   Menopause 08/29/2019    Past Surgical History:  Procedure Laterality Date   BREAST BIOPSY Left 07/04/2022   MM LT BREAST BX W LOC DEV 1ST LESION IMAGE BX SPEC STEREO GUIDE 07/04/2022 [facility]   BREAST BIOPSY Right 07/04/2022   MM RT BREAST BX W LOC DEV 1ST LESION IMAGE BX SPEC STEREO GUIDE 07/04/2022 [facility]   BREAST REDUCTION SURGERY     CESAREAN SECTION     1994, 1999, 2001   NOVASURE ABLATION  2005   abnormal uterine bleeding   REDUCTION MAMMAPLASTY Bilateral 1994    OB  History   No obstetric history on file.      Home Medications    Prior to Admission medications   Medication Sig Start Date End Date Taking? Authorizing Provider  baclofen (LIORESAL) 10 MG tablet Take 1 tablet (10 mg total) by mouth 3 (three) times daily. 02/10/24  Yes Johnie, Lennon Richins A, NP  amLODipine  (NORVASC ) 5 MG tablet Take 1 tablet (5 mg total) by mouth daily. 08/12/23 08/11/24  Arellano Zameza, Priscila, MD  atorvastatin  (LIPITOR) 80 MG tablet Take 1 tablet (80 mg total) by mouth daily. 08/12/23 08/11/24  Arellano Zameza, Priscila, MD  dapagliflozin  propanediol (FARXIGA ) 10 MG TABS tablet Take 1 tablet (10 mg total) by mouth daily with breakfast. 08/12/23   Arellano Zameza, Priscila, MD  glucose blood (ONETOUCH VERIO) test strip Check blood sugar two times daily 05/14/23   Gabino Boga, MD  Microlet Lancets MISC Use to test blood sugar 2 (two) times daily. 10/31/21   Katsadouros, Vasilios, MD  olmesartan  (BENICAR ) 40 MG tablet Take 1 tablet (40 mg total) by mouth daily. 08/12/23   Arellano Zameza, Priscila, MD  pantoprazole  (PROTONIX ) 40 MG tablet Take 1 tablet (40 mg total) by mouth daily. 08/12/23 08/11/24  Arellano Zameza, Priscila, MD  Semaglutide , 1 MG/DOSE, (OZEMPIC , 1 MG/DOSE,) 4 MG/3ML SOPN Inject 1 mg into the skin once a week. 12/24/23   Tobie Gaines, DO  Family History Family History  Problem Relation Age of Onset   Hypertension Mother    Hypertension Father    Diabetes Mellitus II Father    Hypertension Maternal Grandmother    Heart failure Maternal Grandmother    Lymphoma Maternal Grandmother        big lymph nodes    Hypertension Paternal Grandmother    Breast cancer Maternal Aunt    Breast cancer Maternal Aunt 70 - 32   Thyroid  disease Paternal Aunt    Colon cancer Neg Hx    Stomach cancer Neg Hx    Esophageal cancer Neg Hx    Pancreatic cancer Neg Hx     Social History Social History   Tobacco Use   Smoking status: Never   Smokeless tobacco: Never   Vaping Use   Vaping status: Never Used  Substance Use Topics   Alcohol use: Not Currently   Drug use: Never     Allergies   Lisinopril    Review of Systems Review of Systems  Musculoskeletal:  Positive for back pain.   Per HPI  Physical Exam Triage Vital Signs ED Triage Vitals [02/10/24 1906]  Encounter Vitals Group     BP 127/87     Girls Systolic BP Percentile      Girls Diastolic BP Percentile      Boys Systolic BP Percentile      Boys Diastolic BP Percentile      Pulse Rate (!) 104     Resp 16     Temp 98.6 F (37 C)     Temp Source Oral     SpO2 97 %     Weight      Height      Head Circumference      Peak Flow      Pain Score 7     Pain Loc      Pain Education      Exclude from Growth Chart    No data found.  Updated Vital Signs BP 127/87 (BP Location: Right Arm)   Pulse (!) 104   Temp 98.6 F (37 C) (Oral)   Resp 16   SpO2 97%   Visual Acuity Right Eye Distance:   Left Eye Distance:   Bilateral Distance:    Right Eye Near:   Left Eye Near:    Bilateral Near:     Physical Exam Vitals and nursing note reviewed.  Constitutional:      General: She is awake. She is not in acute distress.    Appearance: Normal appearance. She is well-developed and well-groomed. She is not ill-appearing.  Musculoskeletal:     Cervical back: Normal.     Thoracic back: Normal.     Lumbar back: Tenderness present. No swelling, edema, deformity, signs of trauma or bony tenderness. Normal range of motion. Positive left straight leg raise test.       Back:     Comments: Tenderness noted to left low back without spinous process tenderness.  Skin:    General: Skin is warm and dry.  Neurological:     Mental Status: She is alert.  Psychiatric:        Behavior: Behavior is cooperative.      UC Treatments / Results  Labs (all labs ordered are listed, but only abnormal results are displayed) Labs Reviewed - No data to display  EKG   Radiology No results  found.  Procedures Procedures (including critical care time)  Medications Ordered in UC Medications  dexamethasone (DECADRON) injection 10 mg (10 mg Intramuscular Given 02/10/24 1941)    Initial Impression / Assessment and Plan / UC Course  I have reviewed the triage vital signs and the nursing notes.  Pertinent labs & imaging results that were available during my care of the patient were reviewed by me and considered in my medical decision making (see chart for details).     Patient is overall well-appearing.  Vitals are stable.  Pain likely muscular in nature with presence of sciatica.  Patient's latest A1c from 3 months ago was 5.6. Given IM Decadron in clinic for acute pain.  Prescribed baclofen as needed for muscle pain and spasms.  Recommended Tylenol as needed for breakthrough pain.  Given orthopedic follow-up if needed.  Discussed follow-up and return precautions. Final Clinical Impressions(s) / UC Diagnoses   Final diagnoses:  Acute left-sided low back pain with left-sided sciatica     Discharge Instructions      You were given an injection of Decadron in clinic today to help with inflammation related to your pain. You can take baclofen every 8 hours as needed for muscle pain and spasms.  This can make you drowsy so do not drive, work, or drink alcohol while taking this. You can also take 500 to 1000 mg of Tylenol every 6-8 hours as needed for pain.  Do not exceed 4000 mg in 1 day. Alternate between heat and ice and do some gentle stretching to help with pain. You can follow-up with [facility] if your pain continues for further evaluation. Otherwise follow-up with your primary care provider or return here as needed.   ED Prescriptions     Medication Sig Dispense Auth. Provider   baclofen (LIORESAL) 10 MG tablet Take 1 tablet (10 mg total) by mouth 3 (three) times daily. 30 each Johnie Rumaldo LABOR, NP      PDMP not reviewed this encounter.   Johnie Rumaldo LABOR,  NP 02/10/24 1948    Johnie Rumaldo LABOR, NP 02/10/24 1949

## 2024-02-10 NOTE — ED Triage Notes (Signed)
 Patient reports that she has left lower back pain that radiates down the left leg since yesterday. Patient denies any injury or heavy lifting.  Patient states she has been taking a previously prescribed muscle relaxer and Tylenol ES for pain.

## 2024-02-12 ENCOUNTER — Other Ambulatory Visit (HOSPITAL_COMMUNITY): Payer: Self-pay

## 2024-02-12 ENCOUNTER — Other Ambulatory Visit: Payer: Self-pay

## 2024-02-12 ENCOUNTER — Other Ambulatory Visit: Payer: Self-pay | Admitting: Student

## 2024-02-12 DIAGNOSIS — K219 Gastro-esophageal reflux disease without esophagitis: Secondary | ICD-10-CM

## 2024-02-12 MED ORDER — PANTOPRAZOLE SODIUM 40 MG PO TBEC
40.0000 mg | DELAYED_RELEASE_TABLET | Freq: Every day | ORAL | 1 refills | Status: AC
  Filled 2024-02-12: qty 90, 90d supply, fill #0
  Filled 2024-05-12: qty 90, 90d supply, fill #1

## 2024-02-12 NOTE — Telephone Encounter (Signed)
 Medication sent to pharmacy

## 2024-02-29 ENCOUNTER — Encounter: Payer: Self-pay | Admitting: Student

## 2024-02-29 ENCOUNTER — Other Ambulatory Visit: Payer: Self-pay

## 2024-02-29 ENCOUNTER — Ambulatory Visit: Admitting: Student

## 2024-02-29 VITALS — BP 128/78 | HR 82 | Temp 97.8°F | Ht 68.0 in | Wt 235.0 lb

## 2024-02-29 DIAGNOSIS — M5432 Sciatica, left side: Secondary | ICD-10-CM | POA: Diagnosis not present

## 2024-02-29 DIAGNOSIS — Z0000 Encounter for general adult medical examination without abnormal findings: Secondary | ICD-10-CM

## 2024-02-29 DIAGNOSIS — Z7985 Long-term (current) use of injectable non-insulin antidiabetic drugs: Secondary | ICD-10-CM | POA: Diagnosis not present

## 2024-02-29 DIAGNOSIS — E1165 Type 2 diabetes mellitus with hyperglycemia: Secondary | ICD-10-CM

## 2024-02-29 DIAGNOSIS — E66812 Obesity, class 2: Secondary | ICD-10-CM | POA: Diagnosis not present

## 2024-02-29 DIAGNOSIS — E782 Mixed hyperlipidemia: Secondary | ICD-10-CM

## 2024-02-29 DIAGNOSIS — Z6835 Body mass index (BMI) 35.0-35.9, adult: Secondary | ICD-10-CM

## 2024-02-29 DIAGNOSIS — Z833 Family history of diabetes mellitus: Secondary | ICD-10-CM

## 2024-02-29 LAB — POCT GLYCOSYLATED HEMOGLOBIN (HGB A1C): HbA1c, POC (controlled diabetic range): 5.7 % (ref 0.0–7.0)

## 2024-02-29 LAB — GLUCOSE, CAPILLARY: Glucose-Capillary: 96 mg/dL (ref 70–99)

## 2024-02-29 NOTE — Patient Instructions (Signed)
 Remember to bring all of the medications that you take (including over the counter medications and supplements) with you to every clinic visit.  This after visit summary is an important review of tests, referrals, and medication changes that were discussed during your visit. If you have questions or concerns, call [phone]. Outside of clinic business hours, call the main hospital at [phone] and ask the operator for the on-call internal medicine resident.   Ozell Kung MD 02/29/2024, 2:20 PM

## 2024-02-29 NOTE — Assessment & Plan Note (Signed)
 Filed Weights   02/29/24 1338  Weight: 235 lb (106.6 kg)

## 2024-02-29 NOTE — Progress Notes (Unsigned)
 Patient name: Alyssa Buck Date of birth: Apr 29, 1970 Date of visit: 03/01/24  Subjective:  Alyssa Buck is here for a periodic preventive medicine exam.  No new or worsening symptoms. Review of general health and wellness concerns, lifestyle, and risk factors discussed.     Allergies  Allergen Reactions   Lisinopril  Cough    Cough, flushing, nausea    Current Outpatient Medications  Medication Instructions   amLODipine  (NORVASC ) 5 mg, Oral, Daily   Farxiga  10 mg, Oral, Daily with breakfast   glucose blood (ONETOUCH VERIO) test strip Check blood sugar two times daily   Microlet Lancets MISC Use to test blood sugar 2 (two) times daily.   olmesartan  (BENICAR ) 40 mg, Oral, Daily   Ozempic  (1 MG/DOSE) 1 mg, Subcutaneous, Weekly   pantoprazole  (PROTONIX ) 40 mg, Oral, Daily    Past Medical History:  Diagnosis Date   Diabetes (HCC)    High cholesterol    Hypertension     Past Surgical History:  Procedure Laterality Date   BREAST BIOPSY Left 07/04/2022   MM LT BREAST BX W LOC DEV 1ST LESION IMAGE BX SPEC STEREO GUIDE 07/04/2022 [facility]   BREAST BIOPSY Right 07/04/2022   MM RT BREAST BX W LOC DEV 1ST LESION IMAGE BX SPEC STEREO GUIDE 07/04/2022 [facility]   BREAST REDUCTION SURGERY     CESAREAN SECTION     1994, 1999, 2001   NOVASURE ABLATION  2005   abnormal uterine bleeding   REDUCTION MAMMAPLASTY Bilateral 1994    Family History  Problem Relation Age of Onset   Hypertension Mother    Hypertension Father    Diabetes Mellitus II Father    Hypertension Maternal Grandmother    Heart failure Maternal Grandmother    Lymphoma Maternal Grandmother        big lymph nodes    Hypertension Paternal Grandmother    Breast cancer Maternal Aunt    Breast cancer Maternal Aunt 43 - 74   Thyroid  disease Paternal Aunt    Colon cancer Neg Hx    Stomach cancer Neg Hx    Esophageal cancer Neg Hx    Pancreatic cancer Neg Hx     Social History    Socioeconomic History   Marital status: Married    Spouse name: Not on file   Number of children: Not on file   Years of education: Not on file   Highest education level: Not on file  Occupational History   Occupation: early childhood educator  Tobacco Use   Smoking status: Never   Smokeless tobacco: Never  Vaping Use   Vaping status: Never Used  Substance and Sexual Activity   Alcohol use: Not Currently   Drug use: Never   Sexual activity: Not on file  Other Topics Concern   Not on file  Social History Narrative   Not on file   Social Drivers of Health   Financial Resource Strain: Low Risk  (05/14/2022)   Overall Financial Resource Strain (CARDIA)    Difficulty of Paying Living Expenses: Not hard at all  Food Insecurity: No Food Insecurity (09/28/2023)   Hunger Vital Sign    Worried About Running Out of Food in the Last Year: Never true    Ran Out of Food in the Last Year: Never true  Transportation Needs: No Transportation Needs (09/28/2023)   PRAPARE - Administrator, Civil Service (Medical): No    Lack of Transportation (Non-Medical): No  Physical Activity: Inactive (05/14/2022)  Exercise Vital Sign    Days of Exercise per Week: 0 days    Minutes of Exercise per Session: 20 min  Stress: No Stress Concern Present (05/14/2022)   [employer] of Occupational Health - Occupational Stress Questionnaire    Feeling of Stress : Not at all  Social Connections: Moderately Isolated (09/28/2023)   Social Connection and Isolation Panel    Frequency of Communication with Friends and Family: More than three times a week    Frequency of Social Gatherings with Friends and Family: More than three times a week    Attends Religious Services: More than 4 times per year    Active Member of [employer] or Organizations: No    Attends Banker Meetings: Never    Marital Status: Separated  Intimate Partner Violence: Not At Risk (09/28/2023)   Humiliation, Afraid,  Rape, and Kick questionnaire    Fear of Current or Ex-Partner: No    Emotionally Abused: No    Physically Abused: No    Sexually Abused: No      Objective: Today's Vitals   02/29/24 1338 02/29/24 1341  BP: (!) 142/80 128/78  Pulse: 85 82  Temp: 97.8 F (36.6 C)   TempSrc: Oral   SpO2: 100%   Weight: 235 lb (106.6 kg)   Height: 5' 8 (1.727 m)   PainSc: 4    Body mass index is 35.73 kg/m.   Physical Exam Constitutional:      Appearance: Normal appearance.  HENT:     Mouth/Throat:     Mouth: Mucous membranes are moist.     Pharynx: No oropharyngeal exudate or posterior oropharyngeal erythema.  Eyes:     Extraocular Movements: Extraocular movements intact.     Conjunctiva/sclera: Conjunctivae normal.     Pupils: Pupils are equal, round, and reactive to light.  Cardiovascular:     Rate and Rhythm: Normal rate and regular rhythm.     Heart sounds: No murmur heard. Pulmonary:     Effort: Pulmonary effort is normal. No respiratory distress.     Breath sounds: No wheezing.  Abdominal:     General: Bowel sounds are normal. There is no distension.     Tenderness: There is no abdominal tenderness.  Musculoskeletal:        General: No swelling.  Skin:    General: Skin is warm and dry.  Neurological:     Mental Status: She is alert.     Cranial Nerves: No facial asymmetry.     Comments: Normal patellar reflexes, 5/5 plantar/dorsiflexion, hip flexion bilaterally  Psychiatric:        Mood and Affect: Affect normal.        Speech: Speech normal.        Behavior: Behavior normal.     Health Maintenance  Topic Date Due   DTaP/Tdap/Td (1 - Tdap) Never done   Pneumococcal Vaccine: 50+ Years (1 of 2 - PCV) Never done   Hepatitis B Vaccines 19-59 Average Risk (1 of 3 - 19+ 3-dose series) Never done   Zoster Vaccines- Shingrix (1 of 2) Never done   COVID-19 Vaccine (3 - Pfizer risk series) 09/12/2019   Influenza Vaccine  07/12/2024 (Originally 11/13/2023)   Diabetic kidney  evaluation - eGFR measurement  05/13/2024   Diabetic kidney evaluation - Urine ACR  05/13/2024   HEMOGLOBIN A1C  08/28/2024   FOOT EXAM  09/27/2024   OPHTHALMOLOGY EXAM  09/28/2024   Mammogram  06/16/2025   Cervical Cancer Screening (HPV/Pap Cotest)  07/11/2026   Colonoscopy  03/29/2030   Hepatitis C Screening  Completed   HIV Screening  Completed   HPV VACCINES  Aged Out   Meningococcal B Vaccine  Aged Out   COLON CANCER SCREENING ANNUAL FOBT  Discontinued    Assessment and plan: Type 2 diabetes mellitus with hyperglycemia, without long-term current use of insulin (HCC) Assessment & Plan: Lab Results  Component Value Date   HGBA1C 5.7 02/29/2024   HGBA1C 5.6 11/12/2023   HGBA1C 7.2 (A) 08/12/2023   Chronic, stable. Has made durable lifestyle changes. Continue farxiga  10 mg daily, ozempic  1 mg weekly.  Orders: -     POCT glycosylated hemoglobin (Hb A1C) -     Basic metabolic panel with GFR  Left sided sciatica Assessment & Plan: Acute, improving now. Onset ~3 weeks ago. No clearly identifiable triggering injury/activity. Took a course of baclofen from urgent care that didn't help. No alarm findings on exam. Tylenol as needed, ibuprofen if pain is very severe or function limiting. Return if pain worsens.   Obesity, Class II, BMI 35-39.9 Assessment & Plan: BMI Readings from Last 3 Encounters:  02/29/24 35.73 kg/m  11/12/23 35.94 kg/m  09/28/23 36.49 kg/m   Stable weight, doing well.   I recommend eliminating sugary beverages like soda, sweet tea, and juice entirely. I recommended more vegetables, lean protein, and legumes. Frozen vegetables are healthy and inexpensive. Beans are a healthy and inexpensive source of lean protein and fiber. I recommend gradually increasing exercise. A daily walk is a great way to start an exercise program.  Referral: no  Pharmacological intervention: continue ozempic  1 mg weekly    Mixed hyperlipidemia Assessment & Plan: Lipid  Panel     Component Value Date/Time   CHOL 275 (H) 05/14/2023 1228   TRIG 180 (H) 05/14/2023 1228   HDL 45 05/14/2023 1228   CHOLHDL 6.1 (H) 05/14/2023 1228   LDLCALC 196 (H) 05/14/2023 1228   LABVLDL 34 05/14/2023 1228   Chronic, stable. On atorvastatin  80 mg daily. Wishes to pause therapy because she attributes some of her sciatica to this. Doubt this, but will take statin holiday. Return in 6 weeks for lipid panel and revisit therapy then.  Orders: -     Lipid panel -     Lipid panel; Future  Annual physical exam Assessment & Plan: Up to date on ophthalmology exams. Declines flu vaccine. Updating care gaps accordingly.   Other orders -     Glucose, capillary    Return in about 6 weeks (around 04/11/2024) for follow-up of cholesterol.  Ozell Kung MD 03/01/2024, 5:45 AM

## 2024-02-29 NOTE — Progress Notes (Unsigned)
 [facility] Attending  Case discussed with the resident at the time of the visit.  We reviewed the resident's history and exam and pertinent patient test results.  I agree with the assessment, diagnosis, and plan of care documented in the resident's note.

## 2024-03-01 ENCOUNTER — Ambulatory Visit: Payer: Self-pay | Admitting: Student

## 2024-03-01 LAB — BASIC METABOLIC PANEL WITH GFR
BUN/Creatinine Ratio: 35 — ABNORMAL HIGH (ref 9–23)
BUN: 21 mg/dL (ref 6–24)
CO2: 22 mmol/L (ref 20–29)
Calcium: 9.8 mg/dL (ref 8.7–10.2)
Chloride: 107 mmol/L — ABNORMAL HIGH (ref 96–106)
Creatinine, Ser: 0.6 mg/dL (ref 0.57–1.00)
Glucose: 91 mg/dL (ref 70–99)
Potassium: 4.2 mmol/L (ref 3.5–5.2)
Sodium: 142 mmol/L (ref 134–144)
eGFR: 109 mL/min/1.73 (ref >59)

## 2024-03-01 LAB — LIPID PANEL
Chol/HDL Ratio: 3 ratio (ref 0.0–4.4)
Cholesterol, Total: 133 mg/dL (ref 100–199)
HDL: 44 mg/dL (ref >39)
LDL Chol Calc (NIH): 73 mg/dL (ref 0–99)
Triglycerides: 79 mg/dL (ref 0–149)
VLDL Cholesterol Cal: 16 mg/dL (ref 5–40)

## 2024-03-01 NOTE — Assessment & Plan Note (Signed)
 Acute, improving now. Onset ~3 weeks ago. No clearly identifiable triggering injury/activity. Took a course of baclofen from urgent care that didn't help. No alarm findings on exam. Tylenol as needed, ibuprofen if pain is very severe or function limiting. Return if pain worsens.

## 2024-03-01 NOTE — Assessment & Plan Note (Signed)
 Lab Results  Component Value Date   HGBA1C 5.7 02/29/2024   HGBA1C 5.6 11/12/2023   HGBA1C 7.2 (A) 08/12/2023   Chronic, stable. Has made durable lifestyle changes. Continue farxiga  10 mg daily, ozempic  1 mg weekly.

## 2024-03-01 NOTE — Assessment & Plan Note (Signed)
 Up to date on ophthalmology exams. Declines flu vaccine. Updating care gaps accordingly.

## 2024-03-01 NOTE — Assessment & Plan Note (Signed)
 Lipid Panel     Component Value Date/Time   CHOL 275 (H) 05/14/2023 1228   TRIG 180 (H) 05/14/2023 1228   HDL 45 05/14/2023 1228   CHOLHDL 6.1 (H) 05/14/2023 1228   LDLCALC 196 (H) 05/14/2023 1228   LABVLDL 34 05/14/2023 1228   Chronic, stable. On atorvastatin  80 mg daily. Wishes to pause therapy because she attributes some of her sciatica to this. Doubt this, but will take statin holiday. Return in 6 weeks for lipid panel and revisit therapy then.

## 2024-03-07 ENCOUNTER — Other Ambulatory Visit: Payer: Self-pay | Admitting: Student

## 2024-03-07 ENCOUNTER — Other Ambulatory Visit (HOSPITAL_COMMUNITY): Payer: Self-pay

## 2024-03-07 ENCOUNTER — Other Ambulatory Visit: Payer: Self-pay

## 2024-03-08 ENCOUNTER — Other Ambulatory Visit (HOSPITAL_COMMUNITY): Payer: Self-pay

## 2024-03-08 MED ORDER — OZEMPIC (1 MG/DOSE) 4 MG/3ML ~~LOC~~ SOPN
1.0000 mg | PEN_INJECTOR | SUBCUTANEOUS | 2 refills | Status: AC
  Filled 2024-03-08: qty 3, 28d supply, fill #0
  Filled 2024-04-21: qty 3, 28d supply, fill #1
  Filled 2024-05-12 – 2024-05-13 (×2): qty 3, 28d supply, fill #2

## 2024-03-28 ENCOUNTER — Other Ambulatory Visit (HOSPITAL_COMMUNITY): Payer: Self-pay

## 2024-04-11 ENCOUNTER — Other Ambulatory Visit (INDEPENDENT_AMBULATORY_CARE_PROVIDER_SITE_OTHER)

## 2024-04-11 DIAGNOSIS — E782 Mixed hyperlipidemia: Secondary | ICD-10-CM

## 2024-04-12 LAB — LIPID PANEL
Chol/HDL Ratio: 5.2 ratio — ABNORMAL HIGH (ref 0.0–4.4)
Cholesterol, Total: 225 mg/dL — ABNORMAL HIGH (ref 100–199)
HDL: 43 mg/dL
LDL Chol Calc (NIH): 159 mg/dL — ABNORMAL HIGH (ref 0–99)
Triglycerides: 126 mg/dL (ref 0–149)
VLDL Cholesterol Cal: 23 mg/dL (ref 5–40)

## 2024-04-12 NOTE — Progress Notes (Signed)
 Lipid panel taken while on a statin holiday shows LDL increased from 73-159.  10-year ASCVD risk increased from 8% to 13.1%.  Called and discussed this with the patient who is agreeable to resume atorvastatin .  She did not notice any improvement in her sciatic pain while on the statin holiday.  She has a follow-up in about 6 weeks, repeat lipid panel at that time.

## 2024-04-21 ENCOUNTER — Other Ambulatory Visit (HOSPITAL_COMMUNITY): Payer: Self-pay

## 2024-05-12 ENCOUNTER — Other Ambulatory Visit (HOSPITAL_COMMUNITY): Payer: Self-pay

## 2024-05-30 ENCOUNTER — Ambulatory Visit: Admitting: Student
